# Patient Record
Sex: Male | Born: 1970 | Race: White | Hispanic: No | Marital: Married | Smoking: Never smoker
Health system: Southern US, Community
[De-identification: ages and names within clinical notes are randomized; demographics above are authoritative.]

## PROBLEM LIST (undated history)

## (undated) DIAGNOSIS — M1990 Unspecified osteoarthritis, unspecified site: Secondary | ICD-10-CM

## (undated) DIAGNOSIS — M5126 Other intervertebral disc displacement, lumbar region: Secondary | ICD-10-CM

## (undated) DIAGNOSIS — S43006A Unspecified dislocation of unspecified shoulder joint, initial encounter: Secondary | ICD-10-CM

## (undated) DIAGNOSIS — G5600 Carpal tunnel syndrome, unspecified upper limb: Secondary | ICD-10-CM

## (undated) DIAGNOSIS — F419 Anxiety disorder, unspecified: Secondary | ICD-10-CM

## (undated) DIAGNOSIS — J329 Chronic sinusitis, unspecified: Secondary | ICD-10-CM

## (undated) HISTORY — PX: BACK SURGERY: SHX140

## (undated) HISTORY — DX: Unspecified osteoarthritis, unspecified site: M19.90

---
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
## 1993-08-21 HISTORY — PX: LUMBAR LAMINECTOMY/DECOMPRESSION MICRODISCECTOMY: SHX5026

## 1998-08-21 HISTORY — PX: NASAL SINUS SURGERY: SHX719

## 2010-03-24 ENCOUNTER — Ambulatory Visit: Payer: Self-pay | Admitting: General Practice

## 2014-08-21 HISTORY — PX: CARPAL TUNNEL RELEASE: SHX101

## 2015-12-29 ENCOUNTER — Ambulatory Visit
Admission: RE | Admit: 2015-12-29 | Discharge: 2015-12-29 | Disposition: A | Discharge status: Home / Self Care | Payer: BLUE CROSS/BLUE SHIELD | Source: Ambulatory Visit | Attending: Family Medicine | Admitting: Family Medicine

## 2015-12-29 ENCOUNTER — Other Ambulatory Visit: Payer: Self-pay | Admitting: Family Medicine

## 2015-12-29 DIAGNOSIS — M19012 Primary osteoarthritis, left shoulder: Secondary | ICD-10-CM | POA: Diagnosis not present

## 2015-12-29 DIAGNOSIS — M25512 Pain in left shoulder: Secondary | ICD-10-CM | POA: Insufficient documentation

## 2015-12-29 DIAGNOSIS — M96622 Fracture of humerus following insertion of orthopedic implant, joint prosthesis, or bone plate, left arm: Secondary | ICD-10-CM | POA: Insufficient documentation

## 2016-05-16 DIAGNOSIS — M19012 Primary osteoarthritis, left shoulder: Secondary | ICD-10-CM | POA: Diagnosis not present

## 2016-07-25 DIAGNOSIS — M19012 Primary osteoarthritis, left shoulder: Secondary | ICD-10-CM | POA: Diagnosis not present

## 2016-09-20 DIAGNOSIS — M9901 Segmental and somatic dysfunction of cervical region: Secondary | ICD-10-CM | POA: Diagnosis not present

## 2016-09-20 DIAGNOSIS — M531 Cervicobrachial syndrome: Secondary | ICD-10-CM | POA: Diagnosis not present

## 2016-10-13 DIAGNOSIS — M9901 Segmental and somatic dysfunction of cervical region: Secondary | ICD-10-CM | POA: Diagnosis not present

## 2016-10-13 DIAGNOSIS — M531 Cervicobrachial syndrome: Secondary | ICD-10-CM | POA: Diagnosis not present

## 2016-10-19 DIAGNOSIS — M9901 Segmental and somatic dysfunction of cervical region: Secondary | ICD-10-CM | POA: Diagnosis not present

## 2016-10-19 DIAGNOSIS — M531 Cervicobrachial syndrome: Secondary | ICD-10-CM | POA: Diagnosis not present

## 2016-10-20 DIAGNOSIS — M9901 Segmental and somatic dysfunction of cervical region: Secondary | ICD-10-CM | POA: Diagnosis not present

## 2016-10-20 DIAGNOSIS — M531 Cervicobrachial syndrome: Secondary | ICD-10-CM | POA: Diagnosis not present

## 2016-10-23 DIAGNOSIS — M531 Cervicobrachial syndrome: Secondary | ICD-10-CM | POA: Diagnosis not present

## 2016-10-23 DIAGNOSIS — M9901 Segmental and somatic dysfunction of cervical region: Secondary | ICD-10-CM | POA: Diagnosis not present

## 2016-10-26 DIAGNOSIS — M9901 Segmental and somatic dysfunction of cervical region: Secondary | ICD-10-CM | POA: Diagnosis not present

## 2016-10-26 DIAGNOSIS — M531 Cervicobrachial syndrome: Secondary | ICD-10-CM | POA: Diagnosis not present

## 2016-10-31 DIAGNOSIS — M9901 Segmental and somatic dysfunction of cervical region: Secondary | ICD-10-CM | POA: Diagnosis not present

## 2016-10-31 DIAGNOSIS — M19012 Primary osteoarthritis, left shoulder: Secondary | ICD-10-CM | POA: Diagnosis not present

## 2016-10-31 DIAGNOSIS — M531 Cervicobrachial syndrome: Secondary | ICD-10-CM | POA: Diagnosis not present

## 2017-04-12 DIAGNOSIS — M19012 Primary osteoarthritis, left shoulder: Secondary | ICD-10-CM | POA: Diagnosis not present

## 2017-06-27 DIAGNOSIS — M19012 Primary osteoarthritis, left shoulder: Secondary | ICD-10-CM | POA: Diagnosis not present

## 2017-08-29 DIAGNOSIS — M19012 Primary osteoarthritis, left shoulder: Secondary | ICD-10-CM | POA: Diagnosis not present

## 2017-09-27 ENCOUNTER — Encounter: Payer: Self-pay | Admitting: Internal Medicine

## 2017-09-29 LAB — HEPATIC FUNCTION PANEL
ALT: 27 (ref 10–40)
AST: 18 (ref 14–40)
Alkaline Phosphatase: 82 (ref 25–125)
BILIRUBIN, TOTAL: 0.7

## 2017-09-29 LAB — CBC AND DIFFERENTIAL
HEMATOCRIT: 46 (ref 41–53)
HEMOGLOBIN: 16.4 (ref 13.5–17.5)
PLATELETS: 208 (ref 150–399)

## 2017-09-29 LAB — PSA: PSA: 0.9

## 2017-09-29 LAB — LIPID PANEL
Cholesterol: 261 — AB (ref 0–200)
HDL: 54 (ref 35–70)
LDL Cholesterol: 172

## 2017-09-29 LAB — TSH: TSH: 1.92 (ref <=5.90)

## 2017-09-29 LAB — BASIC METABOLIC PANEL: CREATININE: 1.2 (ref <=1.3)

## 2017-10-05 DIAGNOSIS — H6982 Other specified disorders of Eustachian tube, left ear: Secondary | ICD-10-CM | POA: Diagnosis not present

## 2017-11-01 DIAGNOSIS — M19012 Primary osteoarthritis, left shoulder: Secondary | ICD-10-CM | POA: Diagnosis not present

## 2017-12-15 DIAGNOSIS — H1089 Other conjunctivitis: Secondary | ICD-10-CM | POA: Diagnosis not present

## 2017-12-15 DIAGNOSIS — H00022 Hordeolum internum right lower eyelid: Secondary | ICD-10-CM | POA: Diagnosis not present

## 2017-12-25 DIAGNOSIS — M19012 Primary osteoarthritis, left shoulder: Secondary | ICD-10-CM | POA: Diagnosis not present

## 2018-01-29 DIAGNOSIS — M19012 Primary osteoarthritis, left shoulder: Secondary | ICD-10-CM | POA: Diagnosis not present

## 2018-02-06 DIAGNOSIS — M19012 Primary osteoarthritis, left shoulder: Secondary | ICD-10-CM | POA: Diagnosis not present

## 2018-02-13 DIAGNOSIS — M19012 Primary osteoarthritis, left shoulder: Secondary | ICD-10-CM | POA: Diagnosis not present

## 2018-03-13 ENCOUNTER — Ambulatory Visit (INDEPENDENT_AMBULATORY_CARE_PROVIDER_SITE_OTHER): Payer: BLUE CROSS/BLUE SHIELD | Admitting: Internal Medicine

## 2018-03-13 ENCOUNTER — Encounter: Payer: Self-pay | Admitting: Internal Medicine

## 2018-03-13 VITALS — BP 148/96 | HR 57 | Temp 98.1°F | Resp 15 | Ht 74.75 in | Wt 208.0 lb

## 2018-03-13 DIAGNOSIS — R030 Elevated blood-pressure reading, without diagnosis of hypertension: Secondary | ICD-10-CM

## 2018-03-13 DIAGNOSIS — Z9889 Other specified postprocedural states: Secondary | ICD-10-CM

## 2018-03-13 DIAGNOSIS — F409 Phobic anxiety disorder, unspecified: Secondary | ICD-10-CM

## 2018-03-13 DIAGNOSIS — G43109 Migraine with aura, not intractable, without status migrainosus: Secondary | ICD-10-CM

## 2018-03-13 DIAGNOSIS — E785 Hyperlipidemia, unspecified: Secondary | ICD-10-CM | POA: Diagnosis not present

## 2018-03-13 DIAGNOSIS — R7301 Impaired fasting glucose: Secondary | ICD-10-CM | POA: Diagnosis not present

## 2018-03-13 DIAGNOSIS — F5105 Insomnia due to other mental disorder: Secondary | ICD-10-CM

## 2018-03-13 DIAGNOSIS — R5383 Other fatigue: Secondary | ICD-10-CM | POA: Diagnosis not present

## 2018-03-13 DIAGNOSIS — M19112 Post-traumatic osteoarthritis, left shoulder: Secondary | ICD-10-CM

## 2018-03-13 MED ORDER — TRAZODONE HCL 50 MG PO TABS: 25.0000 mg | ORAL_TABLET | Freq: Every evening | ORAL | 3 refills | Status: DC | PRN

## 2018-03-13 NOTE — Progress Notes (Signed)
Subjective:  Patient ID: Justin Jefferson, male    DOB: November 01, 1970  Age: 47 y.o. MRN: [id number]  CC: The primary encounter diagnosis was Hyperlipidemia LDL goal <130. Diagnoses of Impaired fasting blood sugar, Elevated blood pressure affecting pregnancy in first trimester, antepartum, Fatigue, unspecified type, Elevated blood-pressure reading without diagnosis of hypertension, Ocular migraine, S/p bilateral carpal tunnel release, Post-traumatic osteoarthritis of left shoulder, and Insomnia due to anxiety and fear were also pertinent to this visit.  HPI Justin Jefferson presents for establishing care . Referred by wife Justin Jefferson   Anxiety:  Aggravated by work.  Uses xanax (as needed , less then once a week )  Rarely at night.  Drinks 2 beers per dinner,  2-3 ounces bourbon   at bedtime  For chronic insomnia .  Tried melatonin, did not help,  Short trial of ambien during eye surgery .  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    Painful shoulder left side only , doesn't rest well at night .  Prior dislocations,  Multiple requring surgery .    Anticipated shoulder  replacement on August 23.  Needs medical clearance.  History of ocular migraines:  occurring monthly x 18 months,  No recent eye exam  Describes sudden change in vision described as a  pixillated image in both eyes that grows in size  And then recedes before resolving   entire phenomena lasts 20  minutes.    History Justin Jefferson has a past medical history of Arthritis.   He has a past surgical history that includes Lumbar laminectomy/decompression microdiscectomy (1995); Open anterior shoulder reconstruction (Left, 1987); Carpal tunnel release (Right, 2016); and Nasal sinus surgery (2000).   His family history includes Arthritis in his maternal grandmother and paternal grandfather; COPD in his paternal grandfather; Cancer (age of onset: 104) in his father; Depression in his father; Diabetes in his maternal  grandmother and mother; Early death in his maternal grandfather; Hearing loss in his mother and paternal grandmother; Heart attack in his maternal grandfather and paternal grandfather; Heart disease in his maternal grandfather; Hyperlipidemia in his mother; Hypertension in his father, maternal grandfather, mother, and paternal grandfather; Parkinson's disease in his father; Stroke in his maternal grandfather.He reports that he has never smoked. He has never used smokeless tobacco. He reports that he drinks about 21.0 oz of alcohol per week. He reports that he does not use drugs.  Outpatient Medications Prior to Visit  Medication Sig Dispense Refill  . ALPRAZolam (XANAX) 0.25 MG tablet alprazolam 0.25 mg tablet    . meloxicam (MOBIC) 15 MG tablet Take 15 mg by mouth daily. with food  1   No facility-administered medications prior to visit.     Review of Systems:  Patient denies headache, fevers, malaise, unintentional weight loss, skin rash, eye pain, sinus congestion and sinus pain, sore throat, dysphagia,  hemoptysis , cough, dyspnea, wheezing, chest pain, palpitations, orthopnea, edema, abdominal pain, nausea, melena, diarrhea, constipation, flank pain, dysuria, hematuria, urinary  Frequency, nocturia, numbness, tingling, seizures,  Focal weakness, Loss of consciousness,  Tremor,  depression,  and suicidal ideation.     Objective:  BP (!) 148/96 (BP Location: Left Arm, Patient Position: Sitting, Cuff Size: Normal)   Pulse (!) 57   Temp 98.1 F (36.7 C) (Oral)   Resp 15   Ht 4' 2.75" (1.289 m)   Wt 208 lb (94.3 kg)   SpO2 98%   BMI 56.78 kg/m   Physical  Exam:  General appearance: alert, cooperative and appears stated age Ears: normal TM's and external ear canals both ears Throat: lips, mucosa, and tongue normal; teeth and gums normal Neck: no adenopathy, no carotid bruit, supple, symmetrical, trachea midline and thyroid not enlarged, symmetric, no tenderness/mass/nodules Back:  symmetric, no curvature. ROM normal. No CVA tenderness. Lungs: clear to auscultation bilaterally Heart: regular rate and rhythm, S1, S2 normal, no murmur, click, rub or gallop Abdomen: soft, non-tender; bowel sounds normal; no masses,  no organomegaly Pulses: 2+ and symmetric Skin: Skin color, texture, turgor normal. No rashes or lesions Lymph nodes: Cervical, supraclavicular, and axillary nodes normal.   Assessment & Plan:   Problem List Items Addressed This Visit    S/p bilateral carpal tunnel release   Ocular migraine    Diagnosed by previus PCP, occurring once monthly without headache.  No workup unless occurrences change in frequency or character       Relevant Medications   meloxicam (MOBIC) 15 MG tablet   traZODone (DESYREL) 50 MG tablet   Insomnia due to anxiety and fear    He is using excessive alcohol in an attempt to manage chronic insomnia aggravated by work stressors. Advised to reduce alcohol to 2 shots/drinks maximum before dinner and use trazodone given today       Elevated blood-pressure reading without diagnosis of hypertension    he has no history of hypertension but takes meloxicam and has an elevated reading today in office.  He has been asked to check his BP at work and  submit readings for evaluation. Renal function has been historically normal upon review of outside labs from last year and will be checked      DJD of left shoulder    Post traumatic,  Multiple dislocations,  Prior surgery,  Broken fixation screw was noted on plain fils done in 2017 .  Replacement of joint anticipated in late August .  meloxicam and tyl  enoluse reviewed.       Relevant Medications   meloxicam (MOBIC) 15 MG tablet    Other Visit Diagnoses    Hyperlipidemia LDL goal <130    -  Primary   Relevant Orders   Lipid panel   Impaired fasting blood sugar       Relevant Orders   Hemoglobin A1c   Elevated blood pressure affecting pregnancy in first trimester, antepartum        Relevant Orders   Basic metabolic panel   Microalbumin / creatinine urine ratio   Fatigue, unspecified type       Relevant Orders   CBC with Differential/Platelet      I am having Justin Jefferson start on traZODone. I am also having him maintain his meloxicam and ALPRAZolam.  Meds ordered this encounter  Medications  . traZODone (DESYREL) 50 MG tablet    Sig: Take 0.5-1 tablets (25-50 mg total) by mouth at bedtime as needed for sleep.    Dispense:  30 tablet    Refill:  3    There are no discontinued medications.  Follow-up: Return in about 6 months (around 09/13/2018).   Sherlene Shamseresa L Tullo, MD

## 2018-03-13 NOTE — Patient Instructions (Addendum)
Good to meet you!  To summarize our first visit:  1) your blood pressure is  above the currently recommended acceptable standards of 120/70, so I am recommending that you  Get 5 readings over the next month  And send to me    2) I recommend a trial of trazodone 1 hour before bedtime for your insomnia, and reduce your alcohol intake to 2 shots or drinks per night.    3) For your pain ,  You can  Take up to 2000 mg of tylenol daily along with your meloxicam .  Try taking  1000 mg  tylenol at bedtime t    For the preoperative evaluation,  You will need the following labs:  CBC BMET A1c microalbumin /cr ratio

## 2018-03-16 ENCOUNTER — Encounter: Payer: Self-pay | Admitting: Internal Medicine

## 2018-03-16 DIAGNOSIS — R030 Elevated blood-pressure reading, without diagnosis of hypertension: Secondary | ICD-10-CM | POA: Insufficient documentation

## 2018-03-16 DIAGNOSIS — F409 Phobic anxiety disorder, unspecified: Secondary | ICD-10-CM | POA: Insufficient documentation

## 2018-03-16 DIAGNOSIS — F5105 Insomnia due to other mental disorder: Secondary | ICD-10-CM | POA: Insufficient documentation

## 2018-03-16 DIAGNOSIS — G43109 Migraine with aura, not intractable, without status migrainosus: Secondary | ICD-10-CM | POA: Insufficient documentation

## 2018-03-16 DIAGNOSIS — Z9889 Other specified postprocedural states: Secondary | ICD-10-CM | POA: Insufficient documentation

## 2018-03-16 DIAGNOSIS — I10 Essential (primary) hypertension: Secondary | ICD-10-CM | POA: Insufficient documentation

## 2018-03-16 DIAGNOSIS — M19012 Primary osteoarthritis, left shoulder: Secondary | ICD-10-CM | POA: Insufficient documentation

## 2018-03-16 NOTE — Assessment & Plan Note (Addendum)
he has no history of hypertension but takes meloxicam and has an elevated reading today in office.  He has been asked to check his BP at work and  submit readings for evaluation. Renal function has been historically normal upon review of outside labs from last year and will be checked

## 2018-03-16 NOTE — Assessment & Plan Note (Signed)
Diagnosed by previus PCP, occurring once monthly without headache.  No workup unless occurrences change in frequency or character

## 2018-03-16 NOTE — Assessment & Plan Note (Signed)
He is using excessive alcohol in an attempt to manage chronic insomnia aggravated by work stressors. Advised to reduce alcohol to 2 shots/drinks maximum before dinner and use trazodone given today

## 2018-03-16 NOTE — Assessment & Plan Note (Addendum)
Post traumatic,  Multiple dislocations,  Prior surgery,  Broken fixation screw was noted on plain fils done in 2017 .  Replacement of joint anticipated in late August .  meloxicam and tyl  enoluse reviewed.

## 2018-03-20 DIAGNOSIS — E785 Hyperlipidemia, unspecified: Secondary | ICD-10-CM | POA: Diagnosis not present

## 2018-03-20 DIAGNOSIS — R5383 Other fatigue: Secondary | ICD-10-CM | POA: Diagnosis not present

## 2018-03-20 DIAGNOSIS — R030 Elevated blood-pressure reading, without diagnosis of hypertension: Secondary | ICD-10-CM | POA: Diagnosis not present

## 2018-03-20 DIAGNOSIS — R7301 Impaired fasting glucose: Secondary | ICD-10-CM | POA: Diagnosis not present

## 2018-03-21 LAB — BASIC METABOLIC PANEL
BUN/Creatinine Ratio: 9 (ref 9–20)
BUN: 11 mg/dL (ref 6–24)
CALCIUM: 9.5 mg/dL (ref 8.7–10.2)
CO2: 22 mmol/L (ref 20–29)
Chloride: 101 mmol/L (ref 96–106)
Creatinine, Ser: 1.26 mg/dL (ref 0.76–1.27)
GFR calc non Af Amer: 67 mL/min/{1.73_m2} (ref >59)
GFR, EST AFRICAN AMERICAN: 77 mL/min/{1.73_m2} (ref >59)
Glucose: 91 mg/dL (ref 65–99)
Potassium: 4.6 mmol/L (ref 3.5–5.2)
Sodium: 141 mmol/L (ref 134–144)

## 2018-03-21 LAB — CBC WITH DIFFERENTIAL/PLATELET
Basophils Absolute: 0 10*3/uL (ref 0.0–0.2)
Basos: 1 %
EOS (ABSOLUTE): 0.2 10*3/uL (ref 0.0–0.4)
EOS: 3 %
HEMATOCRIT: 47.7 % (ref 37.5–51.0)
Hemoglobin: 16.6 g/dL (ref 13.0–17.7)
IMMATURE GRANS (ABS): 0 10*3/uL (ref 0.0–0.1)
IMMATURE GRANULOCYTES: 0 %
LYMPHS: 33 %
Lymphocytes Absolute: 1.7 10*3/uL (ref 0.7–3.1)
MCH: 34.1 pg — ABNORMAL HIGH (ref 26.6–33.0)
MCHC: 34.8 g/dL (ref 31.5–35.7)
MCV: 98 fL — AB (ref 79–97)
Monocytes Absolute: 0.4 10*3/uL (ref 0.1–0.9)
Monocytes: 8 %
NEUTROS PCT: 55 %
Neutrophils Absolute: 2.9 10*3/uL (ref 1.4–7.0)
PLATELETS: 195 10*3/uL (ref 150–450)
RBC: 4.87 x10E6/uL (ref 4.14–5.80)
RDW: 12.3 % (ref 12.3–15.4)
WBC: 5.3 10*3/uL (ref 3.4–10.8)

## 2018-03-21 LAB — MICROALBUMIN / CREATININE URINE RATIO
Creatinine, Urine: 84.5 mg/dL
Microalb/Creat Ratio: <3.6 mg/g creat (ref 0.0–30.0)

## 2018-03-21 LAB — LIPID PANEL
CHOLESTEROL TOTAL: 225 mg/dL — AB (ref 100–199)
Chol/HDL Ratio: 3.9 ratio (ref 0.0–5.0)
HDL: 57 mg/dL (ref >39)
LDL CALC: 137 mg/dL — AB (ref 0–99)
TRIGLYCERIDES: 156 mg/dL — AB (ref 0–149)
VLDL Cholesterol Cal: 31 mg/dL (ref 5–40)

## 2018-03-21 LAB — HEMOGLOBIN A1C
ESTIMATED AVERAGE GLUCOSE: 91 mg/dL
Hgb A1c MFr Bld: 4.8 % (ref 4.8–5.6)

## 2018-03-25 ENCOUNTER — Encounter: Payer: Self-pay | Admitting: Internal Medicine

## 2018-03-25 ENCOUNTER — Other Ambulatory Visit: Payer: Self-pay

## 2018-03-25 DIAGNOSIS — R7301 Impaired fasting glucose: Secondary | ICD-10-CM | POA: Insufficient documentation

## 2018-04-02 NOTE — H&P (Signed)
   Justin Jefferson is an 47 y.o. male.    Chief Complaint: knee pain  Shoulder pain  HPI: Pt is a 47 y.o. male complaining of left shoulder pain for multiple years. Pain had continually increased since the beginning. X-rays in the clinic show end-stage arthritic changes of the left shoulder. Pt has tried various conservative treatments which have failed to alleviate their symptoms, including injections and therapy. Various options are discussed with the patient. Risks, benefits and expectations were discussed with the patient. Patient understand the risks, benefits and expectations and wishes to proceed with surgery.   PCP:  Sherlene Shamsullo, Teresa L, MD  D/C Plans: Home  PMH: Past Medical History:  Diagnosis Date  . Arthritis     PSH: Past Surgical History:  Procedure Laterality Date  . CARPAL TUNNEL RELEASE Right 2016   [facility]  . LUMBAR LAMINECTOMY/DECOMPRESSION MICRODISCECTOMY  1995   [facility]  . NASAL SINUS SURGERY  2000  . OPEN ANTERIOR SHOULDER RECONSTRUCTION Left 1987   secondary to football dislocation    Social History:  reports that he has never smoked. He has never used smokeless tobacco. He reports that he drinks about 35.0 standard drinks of alcohol per week. He reports that he does not use drugs.  Allergies:  No Known Allergies  Medications: No current facility-administered medications for this encounter.    Current Outpatient Medications  Medication Sig Dispense Refill  . ALPRAZolam (XANAX) 0.25 MG tablet Take 0.25 mg by mouth 2 (two) times daily as needed for anxiety.    . meloxicam (MOBIC) 15 MG tablet Take 15 mg by mouth daily. with food  1  . traZODone (DESYREL) 50 MG tablet Take 0.5-1 tablets (25-50 mg total) by mouth at bedtime as needed for sleep. (Patient taking differently: Take 50 mg by mouth at bedtime. ) 30 tablet 3    No results found for this or any previous visit (from the past 48 hour(s)). No results found.  ROS: Pain  with rom of the left upper extremity  Physical Exam: Alert and oriented 47 y.o. male in no acute distress Cranial nerves 2-12 intact Cervical spine: full rom with no tenderness, nv intact distally Chest: active breath sounds bilaterally, no wheeze rhonchi or rales Heart: regular rate and rhythm, no murmur Abd: non tender non distended with active bowel sounds Hip is stable with rom  Left shoulder with limited/painful rom nv intact distally No rashes or edema  Assessment/Plan Assessment: left shoulder end stage osteoarthritis  Plan:  Patient will undergo a left total vs reverse shoulder by [facility]. Risks benefits and expectations were discussed with the patient. Patient understand risks, benefits and expectations and wishes to proceed. Preoperative templating of the joint replacement has been completed, documented, and submitted to the Operating Room personnel in order to optimize intra-operative equipment management.   Alphonsa OverallBrad Alven Alverio PA-C, MPAS [facility] now [facility]  [place] [address], [place], [place] [address] Phone: [phone] [email or web address] Facebook  [employer]  LinkedIn  Twitter

## 2018-04-03 NOTE — Pre-Procedure Instructions (Signed)
Ralene Okravis G Coombes  04/03/2018      [id number] [place], [place] - [address] [place] [address] Phone: [phone] Fax: [phone]    Your procedure is scheduled on April 12, 2018.  [facility] Ad[facility] at 530 AM.  Call this number if you have problems the morning of surgery:  [phone]   Remember:  Do not eat or drink after midnight.      Take these medicines the morning of surgery with A SIP OF WATER  Alprazolam (xanax)-if needed  7 days prior to surgery STOP taking any meloxicam (mobic), Aspirin (unless otherwise instructed by your surgeon), Aleve, Naproxen, Ibuprofen, Motrin, Advil, Goody's, BC's, all herbal medications, fish oil, and all vitamins   Do not wear jewelry  Do not wear lotions, powders, or colognes, or deodorant.  Men may shave face and neck.  Do not bring valuables to the hospital.  [facility] is not responsible for any belongings or valuables.  Contacts, dentures or bridgework may not be worn into surgery.  Leave your suitcase in the car.  After surgery it may be brought to your room.  For patients admitted to the hospital, discharge time will be determined by your treatment team.  Patients discharged the day of surgery will not be allowed to drive home.    [place]- Preparing For Surgery  Before surgery, you can play an important role. Because skin is not sterile, your skin needs to be as free of germs as possible. You can reduce the number of germs on your skin by washing with CHG (chlorahexidine gluconate) Soap before surgery.  CHG is an antiseptic cleaner which kills germs and bonds with the skin to continue killing germs even after washing.    Oral Hygiene is also important to reduce your risk of infection.  Remember - BRUSH YOUR TEETH THE MORNING OF SURGERY WITH YOUR REGULAR TOOTHPASTE  Please do not use if you have an allergy to CHG or antibacterial soaps. If your skin becomes  reddened/irritated stop using the CHG.  Do not shave (including legs and underarms) for at least 48 hours prior to first CHG shower. It is OK to shave your face.  Please follow these instructions carefully.   1. Shower the NIGHT BEFORE SURGERY and the MORNING OF SURGERY with CHG.   2. If you chose to wash your hair, wash your hair first as usual with your normal shampoo.  3. After you shampoo, rinse your hair and body thoroughly to remove the shampoo.  4. Use CHG as you would any other liquid soap. You can apply CHG directly to the skin and wash gently with a scrungie or a clean washcloth.   5. Apply the CHG Soap to your body ONLY FROM THE NECK DOWN.  Do not use on open wounds or open sores. Avoid contact with your eyes, ears, mouth and genitals (private parts). Wash Face and genitals (private parts)  with your normal soap.  6. Wash thoroughly, paying special attention to the area where your surgery will be performed.  7. Thoroughly rinse your body with warm water from the neck down.  8. DO NOT shower/wash with your normal soap after using and rinsing off the CHG Soap.  9. Pat yourself dry with a CLEAN TOWEL.  10. Wear CLEAN PAJAMAS to bed the night before surgery, wear comfortable clothes the morning of surgery  11. Place CLEAN SHEETS on your bed the night  of your first shower and DO NOT SLEEP WITH PETS.  Day of Surgery:  Do not apply any deodorants/lotions.  Please wear clean clothes to the hospital/surgery center.   Remember to brush your teeth WITH YOUR REGULAR TOOTHPASTE.  Please read over the following fact sheets that you were given.

## 2018-04-03 NOTE — Progress Notes (Addendum)
PCP: Duncan Dulleresa Tullo, MD  Cardiologist:pt denies  EKG: 09/27/17 in EPIC  Stress test: pt denies  ECHO: pt denies  Cardiac Cath: pt denies  Chest x-ray: pt denies past year, no recent respiratory infections/complications

## 2018-04-04 ENCOUNTER — Encounter (HOSPITAL_COMMUNITY): Payer: Self-pay

## 2018-04-04 ENCOUNTER — Other Ambulatory Visit: Payer: Self-pay

## 2018-04-04 ENCOUNTER — Other Ambulatory Visit: Payer: Self-pay | Admitting: Internal Medicine

## 2018-04-04 ENCOUNTER — Encounter (HOSPITAL_COMMUNITY)
Admission: RE | Admit: 2018-04-04 | Discharge: 2018-04-04 | Disposition: A | Discharge status: Home / Self Care | Payer: BLUE CROSS/BLUE SHIELD | Source: Ambulatory Visit | Attending: Orthopedic Surgery | Admitting: Orthopedic Surgery

## 2018-04-04 DIAGNOSIS — M19012 Primary osteoarthritis, left shoulder: Secondary | ICD-10-CM | POA: Insufficient documentation

## 2018-04-04 DIAGNOSIS — Z01812 Encounter for preprocedural laboratory examination: Secondary | ICD-10-CM | POA: Insufficient documentation

## 2018-04-04 HISTORY — DX: Anxiety disorder, unspecified: F41.9

## 2018-04-04 LAB — CBC
HEMATOCRIT: 47.8 % (ref 39.0–52.0)
HEMOGLOBIN: 16.3 g/dL (ref 13.0–17.0)
MCH: 33.1 pg (ref 26.0–34.0)
MCHC: 34.1 g/dL (ref 30.0–36.0)
MCV: 97.2 fL (ref 78.0–100.0)
Platelets: 159 10*3/uL (ref 150–400)
RBC: 4.92 MIL/uL (ref 4.22–5.81)
RDW: 11.5 % (ref 11.5–15.5)
WBC: 4.6 10*3/uL (ref 4.0–10.5)

## 2018-04-04 LAB — SURGICAL PCR SCREEN
MRSA, PCR: NEGATIVE
Staphylococcus aureus: NEGATIVE

## 2018-04-12 ENCOUNTER — Encounter (HOSPITAL_COMMUNITY): Payer: Self-pay | Admitting: *Deleted

## 2018-04-12 ENCOUNTER — Inpatient Hospital Stay (HOSPITAL_COMMUNITY)
Admission: RE | Admit: 2018-04-12 | Discharge: 2018-04-13 | DRG: 483 | Disposition: A | Discharge status: Home / Self Care | Payer: BLUE CROSS/BLUE SHIELD | Source: Ambulatory Visit | Attending: Orthopedic Surgery | Admitting: Orthopedic Surgery

## 2018-04-12 ENCOUNTER — Inpatient Hospital Stay (HOSPITAL_COMMUNITY): Payer: BLUE CROSS/BLUE SHIELD

## 2018-04-12 ENCOUNTER — Other Ambulatory Visit: Payer: Self-pay

## 2018-04-12 ENCOUNTER — Inpatient Hospital Stay (HOSPITAL_COMMUNITY): Payer: BLUE CROSS/BLUE SHIELD | Admitting: Certified Registered Nurse Anesthetist

## 2018-04-12 ENCOUNTER — Encounter (HOSPITAL_COMMUNITY)
Admission: RE | Disposition: A | Discharge status: Home / Self Care | Payer: Self-pay | Source: Ambulatory Visit | Attending: Orthopedic Surgery

## 2018-04-12 DIAGNOSIS — Y793 Surgical instruments, materials and orthopedic devices (including sutures) associated with adverse incidents: Secondary | ICD-10-CM | POA: Diagnosis present

## 2018-04-12 DIAGNOSIS — Z791 Long term (current) use of non-steroidal anti-inflammatories (NSAID): Secondary | ICD-10-CM | POA: Diagnosis not present

## 2018-04-12 DIAGNOSIS — G8918 Other acute postprocedural pain: Secondary | ICD-10-CM | POA: Diagnosis not present

## 2018-04-12 DIAGNOSIS — M19012 Primary osteoarthritis, left shoulder: Secondary | ICD-10-CM | POA: Diagnosis not present

## 2018-04-12 DIAGNOSIS — T84121A Displacement of internal fixation device of left humerus, initial encounter: Secondary | ICD-10-CM | POA: Diagnosis not present

## 2018-04-12 DIAGNOSIS — Z96612 Presence of left artificial shoulder joint: Secondary | ICD-10-CM

## 2018-04-12 DIAGNOSIS — F419 Anxiety disorder, unspecified: Secondary | ICD-10-CM | POA: Diagnosis not present

## 2018-04-12 DIAGNOSIS — T84298A Other mechanical complication of internal fixation device of other bones, initial encounter: Secondary | ICD-10-CM | POA: Diagnosis not present

## 2018-04-12 DIAGNOSIS — T84218A Breakdown (mechanical) of internal fixation device of other bones, initial encounter: Secondary | ICD-10-CM | POA: Diagnosis not present

## 2018-04-12 DIAGNOSIS — T84018A Broken internal joint prosthesis, other site, initial encounter: Secondary | ICD-10-CM | POA: Diagnosis not present

## 2018-04-12 HISTORY — PX: REVERSE SHOULDER ARTHROPLASTY: SHX5054

## 2018-04-12 HISTORY — PX: OTHER SURGICAL HISTORY: SHX169

## 2018-04-12 SURGERY — ARTHROPLASTY, SHOULDER, TOTAL, REVERSE
Anesthesia: General | Site: Shoulder | Laterality: Left

## 2018-04-12 MED ORDER — BUPIVACAINE-EPINEPHRINE 0.25% -1:200000 IJ SOLN
INTRAMUSCULAR | Status: DC | PRN
  Administered 2018-04-12: 30 mL

## 2018-04-12 MED ORDER — HEMOSTATIC AGENTS (NO CHARGE) OPTIME
TOPICAL | Status: DC | PRN
  Administered 2018-04-12: 1 via TOPICAL

## 2018-04-12 MED ORDER — BUPIVACAINE-EPINEPHRINE (PF) 0.25% -1:200000 IJ SOLN
INTRAMUSCULAR | Status: AC
  Filled 2018-04-12: qty 30

## 2018-04-12 MED ORDER — ACETAMINOPHEN 325 MG PO TABS
325.0000 mg | ORAL_TABLET | Freq: Four times a day (QID) | ORAL | Status: DC | PRN
  Administered 2018-04-12: 650 mg via ORAL
  Administered 2018-04-13: 325 mg via ORAL
  Filled 2018-04-12 (×2): qty 2

## 2018-04-12 MED ORDER — ALPRAZOLAM 0.25 MG PO TABS: 0.2500 mg | ORAL_TABLET | Freq: Two times a day (BID) | ORAL | Status: DC | PRN

## 2018-04-12 MED ORDER — HYDROMORPHONE HCL 1 MG/ML IJ SOLN
0.2500 mg | INTRAMUSCULAR | Status: DC | PRN
  Administered 2018-04-12: 0.5 mg via INTRAVENOUS

## 2018-04-12 MED ORDER — THROMBIN 5000 UNITS EX SOLR
CUTANEOUS | Status: DC | PRN
  Administered 2018-04-12: 5000 [IU] via TOPICAL

## 2018-04-12 MED ORDER — LACTATED RINGERS IV SOLN
INTRAVENOUS | Status: DC | PRN
  Administered 2018-04-12 (×2): via INTRAVENOUS

## 2018-04-12 MED ORDER — CEFAZOLIN SODIUM-DEXTROSE 2-4 GM/100ML-% IV SOLN
INTRAVENOUS | Status: AC
  Filled 2018-04-12: qty 100

## 2018-04-12 MED ORDER — ONDANSETRON HCL 4 MG/2ML IJ SOLN
INTRAMUSCULAR | Status: AC
  Filled 2018-04-12: qty 2

## 2018-04-12 MED ORDER — METHOCARBAMOL 500 MG PO TABS: 500.0000 mg | ORAL_TABLET | Freq: Three times a day (TID) | ORAL | 1 refills | Status: DC | PRN

## 2018-04-12 MED ORDER — THROMBIN (RECOMBINANT) 5000 UNITS EX SOLR
CUTANEOUS | Status: AC
  Filled 2018-04-12: qty 5000

## 2018-04-12 MED ORDER — ROPIVACAINE HCL 5 MG/ML IJ SOLN
INTRAMUSCULAR | Status: DC | PRN
  Administered 2018-04-12: 30 mL via PERINEURAL

## 2018-04-12 MED ORDER — CEFAZOLIN SODIUM-DEXTROSE 2-4 GM/100ML-% IV SOLN
2.0000 g | Freq: Four times a day (QID) | INTRAVENOUS | Status: AC
  Administered 2018-04-12 – 2018-04-13 (×3): 2 g via INTRAVENOUS
  Filled 2018-04-12 (×3): qty 100

## 2018-04-12 MED ORDER — CEFAZOLIN SODIUM-DEXTROSE 2-4 GM/100ML-% IV SOLN
2.0000 g | INTRAVENOUS | Status: AC
  Administered 2018-04-12: 2 g via INTRAVENOUS

## 2018-04-12 MED ORDER — METOCLOPRAMIDE HCL 5 MG PO TABS: 5.0000 mg | ORAL_TABLET | Freq: Three times a day (TID) | ORAL | Status: DC | PRN

## 2018-04-12 MED ORDER — 0.9 % SODIUM CHLORIDE (POUR BTL) OPTIME
TOPICAL | Status: DC | PRN
  Administered 2018-04-12: 1000 mL

## 2018-04-12 MED ORDER — OXYCODONE HCL 5 MG/5ML PO SOLN: 5.0000 mg | Freq: Once | ORAL | Status: DC | PRN

## 2018-04-12 MED ORDER — EPHEDRINE 5 MG/ML INJ
INTRAVENOUS | Status: AC
  Filled 2018-04-12: qty 10

## 2018-04-12 MED ORDER — LIDOCAINE 2% (20 MG/ML) 5 ML SYRINGE
INTRAMUSCULAR | Status: DC | PRN
  Administered 2018-04-12: 60 mg via INTRAVENOUS

## 2018-04-12 MED ORDER — ONDANSETRON HCL 4 MG/2ML IJ SOLN
INTRAMUSCULAR | Status: DC | PRN
  Administered 2018-04-12: 4 mg via INTRAVENOUS

## 2018-04-12 MED ORDER — MELOXICAM 7.5 MG PO TABS
15.0000 mg | ORAL_TABLET | Freq: Every day | ORAL | Status: DC
  Administered 2018-04-13: 15 mg via ORAL
  Filled 2018-04-12: qty 2

## 2018-04-12 MED ORDER — HYDROMORPHONE HCL 1 MG/ML IJ SOLN
INTRAMUSCULAR | Status: AC
  Filled 2018-04-12: qty 1

## 2018-04-12 MED ORDER — ONDANSETRON HCL 4 MG/2ML IJ SOLN
4.0000 mg | Freq: Four times a day (QID) | INTRAMUSCULAR | Status: DC | PRN
  Administered 2018-04-12: 4 mg via INTRAVENOUS
  Filled 2018-04-12: qty 2

## 2018-04-12 MED ORDER — MEPERIDINE HCL 50 MG/ML IJ SOLN: 6.2500 mg | INTRAMUSCULAR | Status: DC | PRN

## 2018-04-12 MED ORDER — BISACODYL 10 MG RE SUPP: 10.0000 mg | Freq: Every day | RECTAL | Status: DC | PRN

## 2018-04-12 MED ORDER — OXYCODONE HCL 5 MG PO TABS
5.0000 mg | ORAL_TABLET | ORAL | Status: DC | PRN
  Administered 2018-04-12: 5 mg via ORAL
  Administered 2018-04-13 (×3): 10 mg via ORAL
  Filled 2018-04-12: qty 1
  Filled 2018-04-12 (×3): qty 2

## 2018-04-12 MED ORDER — GLYCOPYRROLATE PF 0.2 MG/ML IJ SOSY
PREFILLED_SYRINGE | INTRAMUSCULAR | Status: DC | PRN
  Administered 2018-04-12: .2 mg via INTRAVENOUS

## 2018-04-12 MED ORDER — SODIUM CHLORIDE 0.9 % IV SOLN: INTRAVENOUS | Status: DC

## 2018-04-12 MED ORDER — TRAZODONE HCL 50 MG PO TABS
50.0000 mg | ORAL_TABLET | Freq: Every day | ORAL | Status: DC
  Administered 2018-04-12: 50 mg via ORAL
  Filled 2018-04-12: qty 1

## 2018-04-12 MED ORDER — HYDROMORPHONE HCL 1 MG/ML IJ SOLN: 0.5000 mg | INTRAMUSCULAR | Status: DC | PRN

## 2018-04-12 MED ORDER — PROPOFOL 10 MG/ML IV BOLUS
INTRAVENOUS | Status: AC
  Filled 2018-04-12: qty 40

## 2018-04-12 MED ORDER — METHOCARBAMOL 500 MG PO TABS
500.0000 mg | ORAL_TABLET | Freq: Four times a day (QID) | ORAL | Status: DC | PRN
  Administered 2018-04-12 – 2018-04-13 (×2): 500 mg via ORAL
  Filled 2018-04-12 (×2): qty 1

## 2018-04-12 MED ORDER — METOCLOPRAMIDE HCL 5 MG/ML IJ SOLN: 5.0000 mg | Freq: Three times a day (TID) | INTRAMUSCULAR | Status: DC | PRN

## 2018-04-12 MED ORDER — LIDOCAINE 2% (20 MG/ML) 5 ML SYRINGE
INTRAMUSCULAR | Status: AC
  Filled 2018-04-12: qty 5

## 2018-04-12 MED ORDER — PROPOFOL 10 MG/ML IV BOLUS
INTRAVENOUS | Status: DC | PRN
  Administered 2018-04-12: 150 mg via INTRAVENOUS

## 2018-04-12 MED ORDER — OXYCODONE HCL 5 MG PO TABS: 5.0000 mg | ORAL_TABLET | Freq: Once | ORAL | Status: DC | PRN

## 2018-04-12 MED ORDER — EPHEDRINE SULFATE-NACL 50-0.9 MG/10ML-% IV SOSY
PREFILLED_SYRINGE | INTRAVENOUS | Status: DC | PRN
  Administered 2018-04-12: 5 mg via INTRAVENOUS
  Administered 2018-04-12: 10 mg via INTRAVENOUS
  Administered 2018-04-12 (×3): 5 mg via INTRAVENOUS

## 2018-04-12 MED ORDER — DEXAMETHASONE SODIUM PHOSPHATE 10 MG/ML IJ SOLN
INTRAMUSCULAR | Status: DC | PRN
  Administered 2018-04-12: 5 mg via INTRAVENOUS

## 2018-04-12 MED ORDER — DEXAMETHASONE SODIUM PHOSPHATE 10 MG/ML IJ SOLN
INTRAMUSCULAR | Status: AC
  Filled 2018-04-12: qty 1

## 2018-04-12 MED ORDER — FENTANYL CITRATE (PF) 250 MCG/5ML IJ SOLN
INTRAMUSCULAR | Status: DC | PRN
  Administered 2018-04-12 (×3): 50 ug via INTRAVENOUS

## 2018-04-12 MED ORDER — PROMETHAZINE HCL 25 MG/ML IJ SOLN: 6.2500 mg | INTRAMUSCULAR | Status: DC | PRN

## 2018-04-12 MED ORDER — MENTHOL 3 MG MT LOZG
1.0000 | LOZENGE | OROMUCOSAL | Status: DC | PRN
  Filled 2018-04-12: qty 9

## 2018-04-12 MED ORDER — SUGAMMADEX SODIUM 200 MG/2ML IV SOLN
INTRAVENOUS | Status: DC | PRN
  Administered 2018-04-12: 200 mg via INTRAVENOUS

## 2018-04-12 MED ORDER — SODIUM CHLORIDE 0.9 % IV SOLN
INTRAVENOUS | Status: DC | PRN
  Administered 2018-04-12: 15 ug/min via INTRAVENOUS

## 2018-04-12 MED ORDER — POLYETHYLENE GLYCOL 3350 17 G PO PACK: 17.0000 g | PACK | Freq: Every day | ORAL | Status: DC | PRN

## 2018-04-12 MED ORDER — OXYCODONE-ACETAMINOPHEN 5-325 MG PO TABS: 1.0000 | ORAL_TABLET | ORAL | 0 refills | Status: DC | PRN

## 2018-04-12 MED ORDER — PHENOL 1.4 % MT LIQD: 1.0000 | OROMUCOSAL | Status: DC | PRN

## 2018-04-12 MED ORDER — CHLORHEXIDINE GLUCONATE 4 % EX LIQD: 60.0000 mL | Freq: Once | CUTANEOUS | Status: DC

## 2018-04-12 MED ORDER — FENTANYL CITRATE (PF) 250 MCG/5ML IJ SOLN
INTRAMUSCULAR | Status: AC
  Filled 2018-04-12: qty 5

## 2018-04-12 MED ORDER — DOCUSATE SODIUM 100 MG PO CAPS
100.0000 mg | ORAL_CAPSULE | Freq: Two times a day (BID) | ORAL | Status: DC
  Administered 2018-04-12 – 2018-04-13 (×2): 100 mg via ORAL
  Filled 2018-04-12 (×2): qty 1

## 2018-04-12 MED ORDER — ONDANSETRON HCL 4 MG PO TABS: 4.0000 mg | ORAL_TABLET | Freq: Four times a day (QID) | ORAL | Status: DC | PRN

## 2018-04-12 MED ORDER — ROCURONIUM BROMIDE 10 MG/ML (PF) SYRINGE
PREFILLED_SYRINGE | INTRAVENOUS | Status: DC | PRN
  Administered 2018-04-12: 50 mg via INTRAVENOUS

## 2018-04-12 MED ORDER — ROCURONIUM BROMIDE 50 MG/5ML IV SOSY
PREFILLED_SYRINGE | INTRAVENOUS | Status: AC
  Filled 2018-04-12: qty 5

## 2018-04-12 MED ORDER — MIDAZOLAM HCL 5 MG/5ML IJ SOLN
INTRAMUSCULAR | Status: DC | PRN
  Administered 2018-04-12: 2 mg via INTRAVENOUS

## 2018-04-12 MED ORDER — METHOCARBAMOL 1000 MG/10ML IJ SOLN
500.0000 mg | Freq: Four times a day (QID) | INTRAVENOUS | Status: DC | PRN
  Filled 2018-04-12: qty 5

## 2018-04-12 MED ORDER — MIDAZOLAM HCL 2 MG/2ML IJ SOLN
INTRAMUSCULAR | Status: AC
  Filled 2018-04-12: qty 2

## 2018-04-12 SURGICAL SUPPLY — 64 items
BIT DRILL 5/64X5 DISP (BIT) ×1 IMPLANT
BLADE SAG 18X100X1.27 (BLADE) ×2 IMPLANT
BODY UNITE ANATOMIC SZ12 (Miscellaneous) ×1 IMPLANT
CEMENT HV SMART SET (Cement) ×1 IMPLANT
CONT SPEC 4OZ CLIKSEAL STRL BL (MISCELLANEOUS) ×1 IMPLANT
COVER SURGICAL LIGHT HANDLE (MISCELLANEOUS) ×2 IMPLANT
DECANTER SPIKE VIAL GLASS SM (MISCELLANEOUS) ×1 IMPLANT
DRAPE INCISE IOBAN 66X45 STRL (DRAPES) ×3 IMPLANT
DRAPE ORTHO SPLIT 77X108 STRL (DRAPES) ×4
DRAPE SURG ORHT 6 SPLT 77X108 (DRAPES) ×2 IMPLANT
DRAPE U-SHAPE 47X51 STRL (DRAPES) ×2 IMPLANT
DRSG ADAPTIC 3X8 NADH LF (GAUZE/BANDAGES/DRESSINGS) ×2 IMPLANT
DRSG PAD ABDOMINAL 8X10 ST (GAUZE/BANDAGES/DRESSINGS) ×2 IMPLANT
DURAPREP 26ML APPLICATOR (WOUND CARE) ×2 IMPLANT
ELECT BLADE 4.0 EZ CLEAN MEGAD (MISCELLANEOUS) ×2
ELECT NDL TIP 2.8 STRL (NEEDLE) ×1 IMPLANT
ELECT NEEDLE TIP 2.8 STRL (NEEDLE) ×2 IMPLANT
ELECT REM PT RETURN 9FT ADLT (ELECTROSURGICAL) ×2
ELECTRODE BLDE 4.0 EZ CLN MEGD (MISCELLANEOUS) ×1 IMPLANT
ELECTRODE REM PT RTRN 9FT ADLT (ELECTROSURGICAL) ×1 IMPLANT
GAUZE SPONGE 4X4 12PLY STRL (GAUZE/BANDAGES/DRESSINGS) ×2 IMPLANT
GLENOID ANCHOR PEG CROSSLK 48 (Orthopedic Implant) ×1 IMPLANT
GLOVE BIOGEL PI ORTHO PRO 7.5 (GLOVE) ×1
GLOVE BIOGEL PI ORTHO PRO SZ8 (GLOVE) ×1
GLOVE ORTHO TXT STRL SZ7.5 (GLOVE) ×2 IMPLANT
GLOVE PI ORTHO PRO STRL 7.5 (GLOVE) ×1 IMPLANT
GLOVE PI ORTHO PRO STRL SZ8 (GLOVE) ×1 IMPLANT
GLOVE SURG ORTHO 8.5 STRL (GLOVE) ×2 IMPLANT
GOWN STRL REUS W/ TWL LRG LVL3 (GOWN DISPOSABLE) ×1 IMPLANT
GOWN STRL REUS W/ TWL XL LVL3 (GOWN DISPOSABLE) ×2 IMPLANT
GOWN STRL REUS W/TWL LRG LVL3 (GOWN DISPOSABLE) ×2
GOWN STRL REUS W/TWL XL LVL3 (GOWN DISPOSABLE) ×4
HEAD HUMERAL ECC 52X18MM (Head) ×1 IMPLANT
KIT BASIN OR (CUSTOM PROCEDURE TRAY) ×2 IMPLANT
KIT TURNOVER KIT B (KITS) ×2 IMPLANT
MANIFOLD NEPTUNE II (INSTRUMENTS) ×2 IMPLANT
NDL 1/2 CIR MAYO (NEEDLE) ×1 IMPLANT
NDL HYPO 25GX1X1/2 BEV (NEEDLE) ×1 IMPLANT
NEEDLE 1/2 CIR MAYO (NEEDLE) ×2 IMPLANT
NEEDLE HYPO 25GX1X1/2 BEV (NEEDLE) ×2 IMPLANT
NS IRRIG 1000ML POUR BTL (IV SOLUTION) ×2 IMPLANT
PACK SHOULDER (CUSTOM PROCEDURE TRAY) ×2 IMPLANT
PAD ARMBOARD 7.5X6 YLW CONV (MISCELLANEOUS) ×4 IMPLANT
PIN METAGLENE 2.5 (PIN) IMPLANT
SLING ARM FOAM STRAP LRG (SOFTGOODS) ×1 IMPLANT
SMARTMIX MINI TOWER (MISCELLANEOUS) ×2
SPONGE LAP 18X18 X RAY DECT (DISPOSABLE) IMPLANT
SPONGE LAP 4X18 RFD (DISPOSABLE) ×2 IMPLANT
SPONGE SURGIFOAM ABS GEL SZ50 (HEMOSTASIS) ×1 IMPLANT
STEM UNITE SZ12 (Stem) ×1 IMPLANT
STRIP CLOSURE SKIN 1/2X4 (GAUZE/BANDAGES/DRESSINGS) ×2 IMPLANT
SUCTION FRAZIER HANDLE 10FR (MISCELLANEOUS) ×1
SUCTION TUBE FRAZIER 10FR DISP (MISCELLANEOUS) ×1 IMPLANT
SUT FIBERWIRE #2 38 T-5 BLUE (SUTURE) ×8
SUT MNCRL AB 4-0 PS2 18 (SUTURE) ×2 IMPLANT
SUT VIC AB 0 CT2 27 (SUTURE) ×2 IMPLANT
SUT VIC AB 2-0 CT1 27 (SUTURE) ×2
SUT VIC AB 2-0 CT1 TAPERPNT 27 (SUTURE) ×1 IMPLANT
SUT VICRYL 0 CT 1 36IN (SUTURE) ×2 IMPLANT
SUTURE FIBERWR #2 38 T-5 BLUE (SUTURE) IMPLANT
SYR CONTROL 10ML LL (SYRINGE) ×2 IMPLANT
TOWEL OR 17X26 10 PK STRL BLUE (TOWEL DISPOSABLE) ×2 IMPLANT
TOWER SMARTMIX MINI (MISCELLANEOUS) IMPLANT
YANKAUER SUCT BULB TIP NO VENT (SUCTIONS) ×3 IMPLANT

## 2018-04-12 NOTE — Discharge Instructions (Signed)
Keep a pillow propped behind the left elbow to keep your arm in front of you and resting across your abdomen.  Ok to use the left hand for gentle self care.  Do your exercises as instructed three to four times daily.  Ice to the front of the left shoulder.  Keep the incision clean and dry for one week, then ok to get the incision wet in the shower.  No driving for two weeks.    Call for follow up appointment in two weeks [phone]

## 2018-04-12 NOTE — Anesthesia Procedure Notes (Signed)
Anesthesia Regional Block: Interscalene brachial plexus block   Pre-Anesthetic Checklist: ,, timeout performed, Correct Patient, Correct Site, Correct Laterality, Correct Procedure, Correct Position, site marked, Risks and benefits discussed,  Surgical consent,  Pre-op evaluation,  At surgeon's request and post-op pain management  Laterality: Left  Prep: chloraprep       Needles:  Injection technique: Single-shot  Needle Type: Stimiplex     Needle Length: 9cm  Needle Gauge: 21     Additional Needles:   Procedures:,,,, ultrasound used (permanent image in chart),,,,  Narrative:  Start time: 04/12/2018 7:03 AM End time: 04/12/2018 7:08 AM Injection made incrementally with aspirations every 5 mL.  Performed by: Personally  Anesthesiologist: Lowella CurbMiller, Warren Ray, MD

## 2018-04-12 NOTE — Transfer of Care (Signed)
Immediate Anesthesia Transfer of Care Note  Patient: Justin Jefferson  Procedure(s) Performed: LEFT SHOULDER ANATOMOC VS. REVERSE SHOULDER ARTHROPLASTY (Left Shoulder)  Patient Location: PACU  Anesthesia Type:GA combined with regional for post-op pain  Level of Consciousness: awake, alert, cooperative  Airway & Oxygen Therapy: Patient Spontanous Breathing and Patient connected to nasal cannula oxygen  Post-op Assessment: Report given to RN and Post -op Vital signs reviewed and stable  Post vital signs: Reviewed and stable  Last Vitals:  Vitals Value Taken Time  BP 117/87 04/12/2018 11:07 AM  Temp    Pulse 95 04/12/2018 11:08 AM  Resp 24 04/12/2018 11:07 AM  SpO2 96 % 04/12/2018 11:08 AM  Vitals shown include unvalidated device data.  Last Pain:  Vitals:   04/12/18 0626  TempSrc: Oral  PainSc:          Complications: No apparent anesthesia complications

## 2018-04-12 NOTE — Anesthesia Preprocedure Evaluation (Signed)
Anesthesia Evaluation  Patient identified by MRN, date of birth, ID band Patient awake    Reviewed: Allergy & Precautions, NPO status , Patient's Chart, lab work & pertinent test results  Airway Mallampati: II  TM Distance: >3 FB Neck ROM: Full    Dental no notable dental hx.    Pulmonary neg pulmonary ROS,    Pulmonary exam normal breath sounds clear to auscultation       Cardiovascular negative cardio ROS Normal cardiovascular exam Rhythm:Regular Rate:Normal     Neuro/Psych  Headaches, Anxiety negative neurological ROS  negative psych ROS   GI/Hepatic negative GI ROS, Neg liver ROS,   Endo/Other  negative endocrine ROS  Renal/GU negative Renal ROS  negative genitourinary   Musculoskeletal negative musculoskeletal ROS (+) Arthritis , Osteoarthritis,    Abdominal   Peds negative pediatric ROS (+)  Hematology negative hematology ROS (+)   Anesthesia Other Findings   Reproductive/Obstetrics negative OB ROS                             Anesthesia Physical Anesthesia Plan  ASA: II  Anesthesia Plan: General   Post-op Pain Management: GA combined w/ Regional for post-op pain   Induction: Intravenous  PONV Risk Score and Plan: 2 and Ondansetron and Midazolam  Airway Management Planned: Oral ETT  Additional Equipment:   Intra-op Plan:   Post-operative Plan: Extubation in OR  Informed Consent: I have reviewed the patients History and Physical, chart, labs and discussed the procedure including the risks, benefits and alternatives for the proposed anesthesia with the patient or authorized representative who has indicated his/her understanding and acceptance.   Dental advisory given  Plan Discussed with: CRNA  Anesthesia Plan Comments:         Anesthesia Quick Evaluation

## 2018-04-12 NOTE — Anesthesia Postprocedure Evaluation (Signed)
Anesthesia Post Note  Patient: Justin Jefferson  Procedure(s) Performed: LEFT SHOULDER ANATOMOC VS. REVERSE SHOULDER ARTHROPLASTY (Left Shoulder)     Patient location during evaluation: PACU Anesthesia Type: General Level of consciousness: awake and alert Pain management: pain level controlled Vital Signs Assessment: post-procedure vital signs reviewed and stable Respiratory status: spontaneous breathing, nonlabored ventilation and respiratory function stable Cardiovascular status: blood pressure returned to baseline and stable Postop Assessment: no apparent nausea or vomiting Anesthetic complications: no    Last Vitals:  Vitals:   04/12/18 1152 04/12/18 1207  BP: 108/76 109/80  Pulse: 81 82  Resp: 13 14  Temp:    SpO2: 93% 92%    Last Pain:  Vitals:   04/12/18 1207  TempSrc:   PainSc: 4                  Lowella CurbWarren Ray Shoshanna Mcquitty

## 2018-04-12 NOTE — Interval H&P Note (Signed)
History and Physical Interval Note:  04/12/2018 7:28 AM  Justin Jefferson  has presented today for surgery, with the diagnosis of Left shoulder osteoarthritis, retained hardware  The various methods of treatment have been discussed with the patient and family. After consideration of risks, benefits and other options for treatment, the patient has consented to  Procedure(s): LEFT SHOULDER ANATOMOC VS. REVERSE SHOULDER ARTHROPLASTY (Left) as a surgical intervention .  The patient's history has been reviewed, patient examined, no change in status, stable for surgery.  I have reviewed the patient's chart and labs.  Questions were answered to the patient's satisfaction.     Michalla Ringer,STEVEN R

## 2018-04-12 NOTE — Op Note (Signed)
NAMRalene Ok: Fischman, Giankarlo G. MEDICAL RECORD [id number] ACCOUNT [id number] DATE OF BIRTH:[birth year] FACILITY: [facility] LOCATION: [facility] PHYSICIAN:STEVEN Russ Halo. Tjay Velazquez, MD  OPERATIVE REPORT  DATE OF PROCEDURE:  04/12/2018  PREOPERATIVE DIAGNOSIS:  Left shoulder end-stage osteoarthritis and retained hardware with broken screw.  POSTOPERATIVE DIAGNOSIS:  Left shoulder end-stage osteoarthritis and retained hardware with broken screw.  PROCEDURE PERFORMED:  Hardware removal, left shoulder, deep with anatomic total shoulder replacement using DePuy Global Unite system.  ATTENDING SURGEON:  Malon KindleSteven Giuliano Preece, MD  ASSISTANT:  Modesto Charonhomas Brad Dixon, [place], who was scrubbed during the entire procedure and necessary for satisfactory completion of surgery.  ANESTHESIA:  General anesthesia was used plus interscalene block.  ESTIMATED BLOOD LOSS:  Minimal.  FLUID REPLACEMENT:  1500 mL crystalloid.  INSTRUMENT COUNTS:  Correct.  COMPLICATIONS:  None.  ANTIBIOTICS:  Perioperative antibiotics were given.  INDICATIONS:  The patient is a 47 year old male with worsening left shoulder pain secondary to end-stage osteoarthritis.  The patient had a prior Bristow procedure for stabilizing his shoulder back as a youth secondary to recurrent instability.  This  involved removing his coracoid process and inserting a stainless-steel screw to hold that in position.  The screw was broken over the years and presented a potential barrier to shoulder placement.  We discussed options for management to include continued  conservative management, which will be done for years, versus surgical treatment.  The patient's pain progressed to the point where he was having daily rest pain and night pain, and this was unrelieved with cortisone injections.  Having failed  conservative management, the patient desires operative treatment to potentially remove the loose screw anteriorly and then move forward with the anatomic versus reverse  shoulder placement.  Risks and benefits of surgery discussed in detail with the  patient.  Informed consent obtained.  DESCRIPTION OF PROCEDURE:  After an adequate level of anesthesia was achieved, the patient was positioned in the modified beach-chair position.  Left shoulder correctly identified and sterilely prepped and draped in the usual manner.  A time-out was  called verifying correct patient and correct site.  After sterile prep and drape and timeout, we entered the shoulder using the patient's prior left deltopectoral incision.  Dissection down through the subcutaneous tissues.  After using a 10 blade for  the skin, we used Bovie down through the subcu tissues, identified the cephalic vein, which was intact.  We moved that laterally with the deltoid.  The pectoralis was taken medially.  The patient's conjoined tendon was not visualized due to the patient's  prior procedure.  The subscapularis was identified.  We tenodesed the biceps in situ with 0 Vicryl figure-of-eight suture.  We then released the subscapularis anatomically off the lesser tuberosity subperiosteally and then tagged that with #2 FiberWire  suture for repair at the end.  Old FiberWire suture or Ethibond suture was removed as we went down to the periosteal area.  We released that off the lesser tuberosity.  We did a complete release of the capsule inferiorly and around posteriorly, exposing  a very arthritic shoulder with large osteophytes.  We then placed the elbow at the patient's side, and with 25 degrees of external rotation, we performed our anterior to posterior cut of the humeral head with an oscillating saw.  We felt that the subscap  and the rotator cuff were sufficient to be able to move forward with an anatomic shoulder placement.  We used a ComptrollerT-handled Crego elevator over the top of the head to protect the rotator cuff  and made our cut.  Once we had that cut completed, we removed  excess osteophytes with a rongeur.  We  then subluxed the humerus posteriorly.  We mobilized the subscapularis off the anterior scapular neck and then identified the prior Bristow/lateral J piece of coracoid with the screw in it, and we were able to  remove that very carefully from the surrounding soft tissues with care taken toward protecting the axillary nerve.  Multiple large loose bodies were removed from the shoulder.  These measured 3-4 cm in diameter, 2 from posterior and one from anterior.   Once we had everything freed up, the subscapularis was gliding smoothly and good exposure of the glenoid.  We found our center point for the glenoid and drilled our central guide pin.  We then sized for a 48 glenoid component.  This is an anchor peg  glenoid.  We then reamed for the 48 glenoid and ____ initially with the power and then peripherally with a hand reamer.  We then drilled our central peg hole and fortunately did not hit the crossing screw that was still in the patient's glenoid vault and  then did our superior and 2 inferior peg holes.  With those holes drilled, we placed a piece of Gelfoam in the peripheral holes to prep for the cementing.  We then used a vacuum mixed high viscosity cement by DePuy on the back table and then injected  that into the 3 peripheral holes and impacted the glenoid APG glenoid which was a 48 into position.  It fit very well, provided excellent coverage, was centered well.  Once the cement was allowed to harden, then we checked the stability of the implant,  and it was stable and was mounted flush to the native glenoid.  At this juncture, we moved towards the humeral side.  We completed our humeral preparation, reaming up to a size 12 and then broaching for the 10 and then 12.  Again, it was about 25 degrees  of retroversion on the cut.  We trialed and then with a 52 x 18 eccentric humeral head which fit very well.  We then retrieved the trial components.  We placed drill holes in the lesser tuberosity and then  placed #2 FiberWire suture for repair of the  subscapularis.  We then placed our press-fit Global Unite stem size 12 into position and impacted that with some available bone graft with impaction grafting technique.  We had a very secure stem, and then we selected the real 52, 18 eccentric head down  posterior superiorly, and that gave perfect humeral coverage of the cut surface of the bone.  The rotator cuff was in good position.  We reduced the shoulder.  Excellent stability with the ability to translate that 50% posteriorly and 50% inferiorly.  We  then repaired the subscapularis anatomically back to the lesser tuberosity.  We had a great repair.  I was able to move the shoulder around nicely.  The repair staying together and extending together as a unit and quite stable.  At this point, we  irrigated thoroughly and then closed the deltopectoral interval with 0 Vicryl suture followed by 2-0 Vicryl for subcutaneous closure and 4-0 Monocryl for skin.  Steri-Strips were applied followed by a sterile dressing.  The patient tolerated surgery  well.  LN/NUANCE  D:04/12/2018 T:04/12/2018 JOB:[id number]

## 2018-04-12 NOTE — Brief Op Note (Signed)
04/12/2018  11:06 AM  PATIENT:  Ralene Okravis G Zinger  47 y.o. male  PRE-OPERATIVE DIAGNOSIS:  Left shoulder osteoarthritis, retained hardware  POST-OPERATIVE DIAGNOSIS:  Left shoulder osteoarthritis, retained hardware  PROCEDURE:  Left shoulder hardware removal and anatomic total shoulder replacement (DePuy- Global Unite)  SURGEON:  Surgeon(s) and Role:    Beverely Low* Janiel Crisostomo, MD - Primary  PHYSICIAN ASSISTANT:   ASSISTANTS: Thea Gisthomas B Dixon, PA-C   ANESTHESIA:   regional and general  EBL:  400 mL   BLOOD ADMINISTERED:none  DRAINS: none   LOCAL MEDICATIONS USED:  MARCAINE     SPECIMEN:  No Specimen  DISPOSITION OF SPECIMEN:  N/A  COUNTS:  YES  TOURNIQUET:  * No tourniquets in log *  DICTATION: .Other Dictation: Dictation Number [id number]  PLAN OF CARE: Admit to inpatient   PATIENT DISPOSITION:  PACU - hemodynamically stable.   Delay start of Pharmacological VTE agent (>24hrs) due to surgical blood loss or risk of bleeding: not applicable

## 2018-04-12 NOTE — Anesthesia Procedure Notes (Addendum)
Procedure Name: Intubation Date/Time: 04/12/2018 7:44 AM Performed by: Colin Benton, CRNA Pre-anesthesia Checklist: Patient identified, Emergency Drugs available, Suction available and Patient being monitored Patient Re-evaluated:Patient Re-evaluated prior to induction Oxygen Delivery Method: Circle system utilized Preoxygenation: Pre-oxygenation with 100% oxygen Induction Type: IV induction Ventilation: Mask ventilation without difficulty Laryngoscope Size: Mac and 4 Grade View: Grade II Tube type: Oral Tube size: 8.0 mm Number of attempts: 2 Airway Equipment and Method: Stylet Placement Confirmation: ETT inserted through vocal cords under direct vision,  positive ETCO2 and breath sounds checked- equal and bilateral Secured at: 24 cm Tube secured with: Tape Dental Injury: Teeth and Oropharynx as per pre-operative assessment  Comments: DL x1 with Mil 2.  Grade 3 view.  DL x 2 with MAC 4 and grade 2 view.  Successful intubation and VSS.  EBBS.

## 2018-04-13 LAB — BASIC METABOLIC PANEL
ANION GAP: 8 (ref 5–15)
BUN: 15 mg/dL (ref 6–20)
CALCIUM: 8.5 mg/dL — AB (ref 8.9–10.3)
CO2: 24 mmol/L (ref 22–32)
Chloride: 106 mmol/L (ref 98–111)
Creatinine, Ser: 1.39 mg/dL — ABNORMAL HIGH (ref 0.61–1.24)
GFR calc Af Amer: >60 mL/min (ref >60)
GFR, EST NON AFRICAN AMERICAN: 59 mL/min — AB (ref >60)
GLUCOSE: 122 mg/dL — AB (ref 70–99)
POTASSIUM: 3.9 mmol/L (ref 3.5–5.1)
Sodium: 138 mmol/L (ref 135–145)

## 2018-04-13 LAB — HEMOGLOBIN AND HEMATOCRIT, BLOOD
HCT: 42.2 % (ref 39.0–52.0)
Hemoglobin: 14.5 g/dL (ref 13.0–17.0)

## 2018-04-13 NOTE — Discharge Summary (Signed)
  Orthopedic Discharge Summary        Physician Discharge Summary  Patient ID: Justin Jefferson MRN: [id number] DOB/AGE: 48/04/1971 48 y.o.  Admit date: 04/12/2018 Discharge date: 04/13/2018   Procedures:  Left shoulder reverse total shoulder replacement  Attending Physician:  Dr. Malon KindleSteven Lejend Dalby  Admission Diagnoses:   Left shoulder OA, end stage  Discharge Diagnoses:  Left shoulder OA, end stage   Past Medical History:  Diagnosis Date  . Anxiety   . Arthritis     PCP: Sherlene Shamsullo, Teresa L, MD   Discharged Condition: good  Hospital Course:  Patient underwent the above stated procedure on 04/12/2018. Patient tolerated the procedure well and brought to the recovery room in good condition and subsequently to the floor. Patient had an uncomplicated hospital course and was stable for discharge.   Disposition: Discharge disposition: 01-Home or Self Care      with follow up in 2 weeks   Follow-up Information    Beverely LowNorris, Dairon Procter, MD. Call in 2 weeks.   Specialty:  Orthopedic Surgery Why:  [phone] Contact information: [address] [place] [place] [address] [phone]           Discharge Instructions    Call MD / Call 911   Complete by:  As directed    If you experience chest pain or shortness of breath, CALL 911 and be transported to the hospital emergency room.  If you develope a fever above 101 F, pus (white drainage) or increased drainage or redness at the wound, or calf pain, call your surgeon's office.   Constipation Prevention   Complete by:  As directed    Drink plenty of fluids.  Prune juice may be helpful.  You may use a stool softener, such as Colace (over the counter) 100 mg twice a day.  Use MiraLax (over the counter) for constipation as needed.   Diet - low sodium heart healthy   Complete by:  As directed    Driving restrictions   Complete by:  As directed    No driving for 2 weeks   Increase activity slowly as tolerated   Complete by:   As directed       Allergies as of 04/13/2018   No Known Allergies     Medication List    TAKE these medications   ALPRAZolam 0.25 MG tablet Commonly known as:  XANAX Take 0.25 mg by mouth 2 (two) times daily as needed for anxiety.   meloxicam 15 MG tablet Commonly known as:  MOBIC Take 15 mg by mouth daily. with food   methocarbamol 500 MG tablet Commonly known as:  ROBAXIN Take 1 tablet (500 mg total) by mouth 3 (three) times daily as needed.   oxyCODONE-acetaminophen 5-325 MG tablet Commonly known as:  PERCOCET/ROXICET Take 1-2 tablets by mouth every 4 (four) hours as needed for moderate pain or severe pain.   traZODone 50 MG tablet Commonly known as:  DESYREL Take 1 tablet (50 mg total) by mouth at bedtime.         Signed: Verlee RossettiORRIS,STEVEN R 04/13/2018, 9:23 AM  [facility]  [place] [address], [place], [place] [address] Phone: [phone] Facebook  Instagram  [employer]LinkedIn  Twitter

## 2018-04-13 NOTE — Evaluation (Signed)
Occupational Therapy Evaluation and Discharge Patient Details Name: Justin Jefferson MRN: [id number] DOB: [birth year] Today's Date: 04/13/2018    History of Present Illness Pt s/p LTSA   Clinical Impression   This 47 yo male admitted with above presents to acute OT with all education completed, we will D/C from acute OT.     Follow Up Recommendations  (follow up per surgeon)    Equipment Recommendations  None recommended by OT       Precautions / Restrictions Precautions Precautions: Shoulder Type of Shoulder Precautions: elbow, wrist, hand--YES; PROM/AROM: FF-90 degrees, ABD-60 degrees, External rotation--30 degrees (all for gentle ADLS) Shoulder Interventions: Shoulder sling/immobilizer;Off for dressing/bathing/exercises Precaution Booklet Issued: Yes (comment) Required Braces or Orthoses: Sling Restrictions Weight Bearing Restrictions: Yes LUE Weight Bearing: Non weight bearing Other Position/Activity Restrictions: No pushing, pulling, lifting      Mobility Bed Mobility Overal bed mobility: Modified Independent             General bed mobility comments: rolling and then up to sit from right side  Transfers Overall transfer level: Independent                        ADL either performed or assessed with clinical judgement         Vision Patient Visual Report: No change from baseline              Pertinent Vitals/Pain Pain Assessment: 0-10 Pain Score: 5  Pain Location: left shoulder Pain Descriptors / Indicators: Aching;Sore Pain Intervention(s): Limited activity within patient's tolerance;Monitored during session;Premedicated before session;Ice applied     Hand Dominance Right   Extremity/Trunk Assessment Upper Extremity Assessment Upper Extremity Assessment: LUE deficits/detail LUE Deficits / Details: shoulder sx this admission, elbow and distally pt WNL (does report forearm tightness) LUE Coordination: decreased gross motor            Communication Communication Communication: No difficulties   Cognition Arousal/Alertness: Awake/alert Behavior During Therapy: WFL for tasks assessed/performed Overall Cognitive Status: Within Functional Limits for tasks assessed                                        Exercises Other Exercises Other Exercises: Pt completed AROM for elbow, wrist, forearm, and hand (educated to do them 3x/day 10 reps each up to 5x/day if wants to)   Shoulder Instructions Shoulder Instructions Donning/doffing shirt without moving shoulder: Caregiver independent with task Method for sponge bathing under operated UE: Caregiver independent with task Donning/doffing sling/immobilizer: Caregiver independent with task Correct positioning of sling/immobilizer: Caregiver independent with task Pendulum exercises (written home exercise program): (NA) ROM for elbow, wrist and digits of operated UE: Independent Sling wearing schedule (on at all times/off for ADL's): Independent Proper positioning of operated UE when showering: Independent Dressing change: (NA) Positioning of UE while sleeping: Caregiver independent with task    Home Living Family/patient expects to be discharged to:: Private residence Living Arrangements: Spouse/significant other Available Help at Discharge: Family(intermittently 1st week and all the time 2nd week)               Bathroom Shower/Tub: Walk-in shower;Door   [employer] Toilet: Standard     Home Equipment: Hand held shower head          Prior Functioning/Environment Level of Independence: Independent  OT Problem List: Decreased range of motion;Decreased strength;Pain         OT Goals(Current goals can be found in the care plan section) Acute Rehab OT Goals Patient Stated Goal: home today  OT Frequency:                AM-PAC PT "6 Clicks" Daily Activity     Outcome Measure Help from another person eating meals?:  None Help from another person taking care of personal grooming?: A Little Help from another person toileting, which includes using toliet, bedpan, or urinal?: A Little Help from another person bathing (including washing, rinsing, drying)?: A Little Help from another person to put on and taking off regular upper body clothing?: A Little Help from another person to put on and taking off regular lower body clothing?: A Little 6 Click Score: 19   End of Session Equipment Utilized During Treatment: Other (comment)(sling) Nurse Communication: (Pt ready to go from therapy standpoint)  Activity Tolerance: Patient tolerated treatment well Patient left: in bed;with call bell/phone within reach;with family/visitor present(ice on left pectoral area)  OT Visit Diagnosis: Pain Pain - Right/Left: Left Pain - part of body: Shoulder                Time: [id number] OT Time Calculation (min): 35 min Charges:  OT General Charges $OT Visit: 1 Visit OT Evaluation $OT Eval Moderate Complexity: 1 Mod OT Treatments $Self Care/Home Management : 8-22 mins  Ignacia PalmaCathy Judea Riches, OTR/L [phone] 04/13/2018

## 2018-04-13 NOTE — Progress Notes (Signed)
Patient alert and oriented, mae's well, voiding adequate amount of urine, swallowing without difficulty, c/o mild pain at time of discharge. Patient discharged home with family. Script and discharged instructions given to patient. Patient and family stated understanding of instructions given. Patient has an appointment with Dr. Norris   

## 2018-04-13 NOTE — Progress Notes (Signed)
Orthopedics Progress Note  Subjective: Pain last night when block wore off, better controlled this morning.  Objective:  Vitals:   04/13/18 0303 04/13/18 0731  BP: 118/83 113/70  Pulse: 75 82  Resp: 18 18  Temp: 98.7 F (37.1 C) 98.7 F (37.1 C)  SpO2: 98% 94%    General: Awake and alert  Musculoskeletal: Left shoulder wound looks good with no erythema and no drainage. Dressing changed to Aquacel. Neurovascularly intact  Lab Results  Component Value Date   WBC 4.6 04/04/2018   HGB 14.5 04/13/2018   HCT 42.2 04/13/2018   MCV 97.2 04/04/2018   PLT 159 04/04/2018       Component Value Date/Time   NA 138 04/13/2018 0620   NA 141 03/20/2018 0951   K 3.9 04/13/2018 0620   CL 106 04/13/2018 0620   CO2 24 04/13/2018 0620   GLUCOSE 122 (H) 04/13/2018 0620   BUN 15 04/13/2018 0620   BUN 11 03/20/2018 0951   CREATININE 1.39 (H) 04/13/2018 0620   CALCIUM 8.5 (L) 04/13/2018 0620   GFRNONAA 59 (L) 04/13/2018 0620   GFRAA >60 04/13/2018 [id number]    No results found for: INR, PROTIME  Assessment/Plan: POD #1 s/p Procedure(s): LEFT SHOULDER ANATOMOC VS. REVERSE SHOULDER ARTHROPLASTY Ot went well this morning. Pain controlled. Discharge to home. Follow up in two weeks in the office.   Almedia BallsSteven R. Ranell PatrickNorris, MD 04/13/2018 9:21 AM

## 2018-04-15 ENCOUNTER — Encounter (HOSPITAL_COMMUNITY): Payer: Self-pay | Admitting: Orthopedic Surgery

## 2018-04-15 MED FILL — Thrombin (Recombinant) For Soln 5000 Unit: CUTANEOUS | Qty: 5000 | Status: AC

## 2018-04-25 DIAGNOSIS — M25512 Pain in left shoulder: Secondary | ICD-10-CM | POA: Diagnosis not present

## 2018-04-25 DIAGNOSIS — Z5189 Encounter for other specified aftercare: Secondary | ICD-10-CM | POA: Insufficient documentation

## 2018-04-29 DIAGNOSIS — M6281 Muscle weakness (generalized): Secondary | ICD-10-CM | POA: Diagnosis not present

## 2018-04-29 DIAGNOSIS — M25512 Pain in left shoulder: Secondary | ICD-10-CM | POA: Diagnosis not present

## 2018-04-29 DIAGNOSIS — M25612 Stiffness of left shoulder, not elsewhere classified: Secondary | ICD-10-CM | POA: Diagnosis not present

## 2018-05-02 DIAGNOSIS — M25512 Pain in left shoulder: Secondary | ICD-10-CM | POA: Diagnosis not present

## 2018-05-02 DIAGNOSIS — M25612 Stiffness of left shoulder, not elsewhere classified: Secondary | ICD-10-CM | POA: Diagnosis not present

## 2018-05-02 DIAGNOSIS — M6281 Muscle weakness (generalized): Secondary | ICD-10-CM | POA: Diagnosis not present

## 2018-05-07 DIAGNOSIS — M25612 Stiffness of left shoulder, not elsewhere classified: Secondary | ICD-10-CM | POA: Diagnosis not present

## 2018-05-07 DIAGNOSIS — M6281 Muscle weakness (generalized): Secondary | ICD-10-CM | POA: Diagnosis not present

## 2018-05-07 DIAGNOSIS — M25512 Pain in left shoulder: Secondary | ICD-10-CM | POA: Diagnosis not present

## 2018-05-09 DIAGNOSIS — M6281 Muscle weakness (generalized): Secondary | ICD-10-CM | POA: Diagnosis not present

## 2018-05-09 DIAGNOSIS — M25612 Stiffness of left shoulder, not elsewhere classified: Secondary | ICD-10-CM | POA: Diagnosis not present

## 2018-05-09 DIAGNOSIS — M25512 Pain in left shoulder: Secondary | ICD-10-CM | POA: Diagnosis not present

## 2018-05-14 DIAGNOSIS — M6281 Muscle weakness (generalized): Secondary | ICD-10-CM | POA: Diagnosis not present

## 2018-05-14 DIAGNOSIS — M25512 Pain in left shoulder: Secondary | ICD-10-CM | POA: Diagnosis not present

## 2018-05-14 DIAGNOSIS — M25612 Stiffness of left shoulder, not elsewhere classified: Secondary | ICD-10-CM | POA: Diagnosis not present

## 2018-05-16 DIAGNOSIS — M25512 Pain in left shoulder: Secondary | ICD-10-CM | POA: Diagnosis not present

## 2018-05-16 DIAGNOSIS — M6281 Muscle weakness (generalized): Secondary | ICD-10-CM | POA: Diagnosis not present

## 2018-05-16 DIAGNOSIS — M25612 Stiffness of left shoulder, not elsewhere classified: Secondary | ICD-10-CM | POA: Diagnosis not present

## 2018-05-21 ENCOUNTER — Other Ambulatory Visit: Payer: Self-pay | Admitting: Internal Medicine

## 2018-05-21 DIAGNOSIS — M25512 Pain in left shoulder: Secondary | ICD-10-CM | POA: Diagnosis not present

## 2018-05-21 DIAGNOSIS — M6281 Muscle weakness (generalized): Secondary | ICD-10-CM | POA: Diagnosis not present

## 2018-05-21 DIAGNOSIS — M25612 Stiffness of left shoulder, not elsewhere classified: Secondary | ICD-10-CM | POA: Diagnosis not present

## 2018-05-23 DIAGNOSIS — M25612 Stiffness of left shoulder, not elsewhere classified: Secondary | ICD-10-CM | POA: Diagnosis not present

## 2018-05-23 DIAGNOSIS — Z96612 Presence of left artificial shoulder joint: Secondary | ICD-10-CM | POA: Diagnosis not present

## 2018-05-23 DIAGNOSIS — M25512 Pain in left shoulder: Secondary | ICD-10-CM | POA: Diagnosis not present

## 2018-05-23 DIAGNOSIS — Z471 Aftercare following joint replacement surgery: Secondary | ICD-10-CM | POA: Diagnosis not present

## 2018-05-23 DIAGNOSIS — M6281 Muscle weakness (generalized): Secondary | ICD-10-CM | POA: Diagnosis not present

## 2018-05-23 NOTE — Telephone Encounter (Signed)
Last refill 10/02/17 Last office visit 03/13/18 Next office visit 09/13/18

## 2018-05-28 DIAGNOSIS — M25512 Pain in left shoulder: Secondary | ICD-10-CM | POA: Diagnosis not present

## 2018-05-28 DIAGNOSIS — M25612 Stiffness of left shoulder, not elsewhere classified: Secondary | ICD-10-CM | POA: Diagnosis not present

## 2018-05-28 DIAGNOSIS — M6281 Muscle weakness (generalized): Secondary | ICD-10-CM | POA: Diagnosis not present

## 2018-05-30 DIAGNOSIS — M25512 Pain in left shoulder: Secondary | ICD-10-CM | POA: Diagnosis not present

## 2018-05-30 DIAGNOSIS — M6281 Muscle weakness (generalized): Secondary | ICD-10-CM | POA: Diagnosis not present

## 2018-05-30 DIAGNOSIS — M25612 Stiffness of left shoulder, not elsewhere classified: Secondary | ICD-10-CM | POA: Diagnosis not present

## 2018-06-04 DIAGNOSIS — M25512 Pain in left shoulder: Secondary | ICD-10-CM | POA: Diagnosis not present

## 2018-06-04 DIAGNOSIS — M25612 Stiffness of left shoulder, not elsewhere classified: Secondary | ICD-10-CM | POA: Diagnosis not present

## 2018-06-04 DIAGNOSIS — M6281 Muscle weakness (generalized): Secondary | ICD-10-CM | POA: Diagnosis not present

## 2018-06-06 DIAGNOSIS — M25512 Pain in left shoulder: Secondary | ICD-10-CM | POA: Diagnosis not present

## 2018-06-06 DIAGNOSIS — M6281 Muscle weakness (generalized): Secondary | ICD-10-CM | POA: Diagnosis not present

## 2018-06-06 DIAGNOSIS — M25612 Stiffness of left shoulder, not elsewhere classified: Secondary | ICD-10-CM | POA: Diagnosis not present

## 2018-06-11 DIAGNOSIS — M6281 Muscle weakness (generalized): Secondary | ICD-10-CM | POA: Diagnosis not present

## 2018-06-11 DIAGNOSIS — M25612 Stiffness of left shoulder, not elsewhere classified: Secondary | ICD-10-CM | POA: Diagnosis not present

## 2018-06-11 DIAGNOSIS — M25512 Pain in left shoulder: Secondary | ICD-10-CM | POA: Diagnosis not present

## 2018-06-20 DIAGNOSIS — M25612 Stiffness of left shoulder, not elsewhere classified: Secondary | ICD-10-CM | POA: Diagnosis not present

## 2018-06-20 DIAGNOSIS — M25512 Pain in left shoulder: Secondary | ICD-10-CM | POA: Diagnosis not present

## 2018-06-20 DIAGNOSIS — M6281 Muscle weakness (generalized): Secondary | ICD-10-CM | POA: Diagnosis not present

## 2018-06-25 DIAGNOSIS — M6281 Muscle weakness (generalized): Secondary | ICD-10-CM | POA: Diagnosis not present

## 2018-06-25 DIAGNOSIS — M25512 Pain in left shoulder: Secondary | ICD-10-CM | POA: Diagnosis not present

## 2018-06-25 DIAGNOSIS — M25612 Stiffness of left shoulder, not elsewhere classified: Secondary | ICD-10-CM | POA: Diagnosis not present

## 2018-07-02 DIAGNOSIS — M6281 Muscle weakness (generalized): Secondary | ICD-10-CM | POA: Diagnosis not present

## 2018-07-02 DIAGNOSIS — M25612 Stiffness of left shoulder, not elsewhere classified: Secondary | ICD-10-CM | POA: Diagnosis not present

## 2018-07-02 DIAGNOSIS — M25512 Pain in left shoulder: Secondary | ICD-10-CM | POA: Diagnosis not present

## 2018-07-09 DIAGNOSIS — M25512 Pain in left shoulder: Secondary | ICD-10-CM | POA: Diagnosis not present

## 2018-07-09 DIAGNOSIS — M25612 Stiffness of left shoulder, not elsewhere classified: Secondary | ICD-10-CM | POA: Diagnosis not present

## 2018-07-09 DIAGNOSIS — M6281 Muscle weakness (generalized): Secondary | ICD-10-CM | POA: Diagnosis not present

## 2018-07-16 DIAGNOSIS — M25612 Stiffness of left shoulder, not elsewhere classified: Secondary | ICD-10-CM | POA: Diagnosis not present

## 2018-07-16 DIAGNOSIS — M25512 Pain in left shoulder: Secondary | ICD-10-CM | POA: Diagnosis not present

## 2018-07-16 DIAGNOSIS — M6281 Muscle weakness (generalized): Secondary | ICD-10-CM | POA: Diagnosis not present

## 2018-07-25 DIAGNOSIS — M25512 Pain in left shoulder: Secondary | ICD-10-CM | POA: Diagnosis not present

## 2018-07-25 DIAGNOSIS — M6281 Muscle weakness (generalized): Secondary | ICD-10-CM | POA: Diagnosis not present

## 2018-07-25 DIAGNOSIS — M25612 Stiffness of left shoulder, not elsewhere classified: Secondary | ICD-10-CM | POA: Diagnosis not present

## 2018-07-30 DIAGNOSIS — M6281 Muscle weakness (generalized): Secondary | ICD-10-CM | POA: Diagnosis not present

## 2018-07-30 DIAGNOSIS — M25612 Stiffness of left shoulder, not elsewhere classified: Secondary | ICD-10-CM | POA: Diagnosis not present

## 2018-07-30 DIAGNOSIS — M25512 Pain in left shoulder: Secondary | ICD-10-CM | POA: Diagnosis not present

## 2018-08-06 DIAGNOSIS — M6281 Muscle weakness (generalized): Secondary | ICD-10-CM | POA: Diagnosis not present

## 2018-08-06 DIAGNOSIS — M25612 Stiffness of left shoulder, not elsewhere classified: Secondary | ICD-10-CM | POA: Diagnosis not present

## 2018-08-06 DIAGNOSIS — M25512 Pain in left shoulder: Secondary | ICD-10-CM | POA: Diagnosis not present

## 2018-08-08 DIAGNOSIS — G5601 Carpal tunnel syndrome, right upper limb: Secondary | ICD-10-CM | POA: Insufficient documentation

## 2018-08-08 DIAGNOSIS — Z471 Aftercare following joint replacement surgery: Secondary | ICD-10-CM | POA: Diagnosis not present

## 2018-08-08 DIAGNOSIS — Z96612 Presence of left artificial shoulder joint: Secondary | ICD-10-CM | POA: Diagnosis not present

## 2018-08-21 HISTORY — PX: CARPAL TUNNEL RELEASE: SHX101

## 2018-08-28 ENCOUNTER — Encounter

## 2018-08-28 ENCOUNTER — Ambulatory Visit: Payer: BLUE CROSS/BLUE SHIELD | Admitting: Internal Medicine

## 2018-09-02 ENCOUNTER — Encounter: Payer: Self-pay | Admitting: Internal Medicine

## 2018-09-02 ENCOUNTER — Ambulatory Visit (INDEPENDENT_AMBULATORY_CARE_PROVIDER_SITE_OTHER): Payer: BLUE CROSS/BLUE SHIELD | Admitting: Internal Medicine

## 2018-09-02 ENCOUNTER — Encounter

## 2018-09-02 ENCOUNTER — Ambulatory Visit: Payer: BLUE CROSS/BLUE SHIELD | Admitting: Internal Medicine

## 2018-09-02 DIAGNOSIS — M65341 Trigger finger, right ring finger: Secondary | ICD-10-CM | POA: Diagnosis not present

## 2018-09-02 DIAGNOSIS — Z9889 Other specified postprocedural states: Secondary | ICD-10-CM | POA: Diagnosis not present

## 2018-09-02 DIAGNOSIS — F5105 Insomnia due to other mental disorder: Secondary | ICD-10-CM

## 2018-09-02 DIAGNOSIS — R030 Elevated blood-pressure reading, without diagnosis of hypertension: Secondary | ICD-10-CM

## 2018-09-02 DIAGNOSIS — F409 Phobic anxiety disorder, unspecified: Secondary | ICD-10-CM

## 2018-09-02 DIAGNOSIS — R7301 Impaired fasting glucose: Secondary | ICD-10-CM

## 2018-09-02 MED ORDER — PREDNISONE 10 MG PO TABS: ORAL_TABLET | ORAL | 0 refills | Status: DC

## 2018-09-02 NOTE — Progress Notes (Signed)
Subjective:  Patient ID: Justin Jefferson, male    DOB: 08/27/1970  Age: 48 y.o. MRN: [id number]  CC: Diagnoses of S/p bilateral carpal tunnel release, Impaired fasting glucose, Insomnia due to anxiety and fear, Trigger ring finger of right hand, and Elevated blood-pressure reading without diagnosis of hypertension were pertinent to this visit.  HPI Justin Jefferson presents for  Follow up and medication  refill.  He was  last seen in July for new patient/preop evaluation  for left shoulder replacement    history  of Anxiety disorder managed with xanax and trazodone for  Insomnia. Had a blackout reaction to Metcalfambien.   Getting a CPE with labs in 2 weeks at work.    Seeing ortmann at [facility]  For hand issues. Trigger finger right  4th  since May  Radiates to forearm. CT on the left  Bilateral Chronic for years . Right hand released   2018,  Both sides resolved until his shoulder surgery  .    Outpatient Medications Prior to Visit  Medication Sig Dispense Refill  . ALPRAZolam (XANAX) 0.25 MG tablet TAKE 1 TABLET BY MOUTH EVERY DAY AS NEEDED 30 tablet 4  . meloxicam (MOBIC) 15 MG tablet Take 15 mg by mouth daily. with food  1  . traZODone (DESYREL) 50 MG tablet Take 1 tablet (50 mg total) by mouth at bedtime. 90 tablet 1  . methocarbamol (ROBAXIN) 500 MG tablet Take 1 tablet (500 mg total) by mouth 3 (three) times daily as needed. (Patient not taking: Reported on 09/02/2018) 60 tablet 1  . oxyCODONE-acetaminophen (PERCOCET) 5-325 MG tablet Take 1-2 tablets by mouth every 4 (four) hours as needed for moderate pain or severe pain. (Patient not taking: Reported on 09/02/2018) 30 tablet 0   No facility-administered medications prior to visit.     Review of Systems;  Patient denies headache, fevers, malaise, unintentional weight loss, skin rash, eye pain, sinus congestion and sinus pain, sore throat, dysphagia,  hemoptysis , cough, dyspnea, wheezing, chest pain, palpitations, orthopnea, edema,  abdominal pain, nausea, melena, diarrhea, constipation, flank pain, dysuria, hematuria, urinary  Frequency, nocturia, numbness, tingling, seizures,  Focal weakness, Loss of consciousness,  Tremor, insomnia, depression, anxiety, and suicidal ideation.      Objective:  BP 134/84 (BP Location: Left Arm, Patient Position: Sitting, Cuff Size: Large)   Pulse (!) 59   Temp 98 F (36.7 C) (Oral)   Resp 15   Ht 5\' 10"  (1.778 m)   Wt 210 lb 6.4 oz (95.4 kg)   SpO2 96%   BMI 30.19 kg/m   BP Readings from Last 3 Encounters:  09/02/18 134/84  04/13/18 113/70  04/04/18 (!) 143/87    Wt Readings from Last 3 Encounters:  09/02/18 210 lb 6.4 oz (95.4 kg)  04/12/18 205 lb (93 kg)  04/04/18 208 lb 3.2 oz (94.4 kg)    General appearance: alert, cooperative and appears stated age Ears: normal TM's and external ear canals both ears Throat: lips, mucosa, and tongue normal; teeth and gums normal Neck: no adenopathy, no carotid bruit, supple, symmetrical, trachea midline and thyroid not enlarged, symmetric, no tenderness/mass/nodules Back: symmetric, no curvature. ROM normal. No CVA tenderness. Lungs: clear to auscultation bilaterally Heart: regular rate and rhythm, S1, S2 normal, no murmur, click, rub or gallop Abdomen: soft, non-tender; bowel sounds normal; no masses,  no organomegaly Pulses: 2+ and symmetric Skin: Skin color, texture, turgor normal. No rashes or lesions Lymph nodes: Cervical, supraclavicular, and axillary nodes  normal.  Lab Results  Component Value Date   HGBA1C 4.8 03/20/2018    Lab Results  Component Value Date   CREATININE 1.39 (H) 04/13/2018   CREATININE 1.26 03/20/2018   CREATININE 1.2 09/29/2017    Lab Results  Component Value Date   WBC 4.6 04/04/2018   HGB 14.5 04/13/2018   HCT 42.2 04/13/2018   PLT 159 04/04/2018   GLUCOSE 122 (H) 04/13/2018   CHOL 225 (H) 03/20/2018   TRIG 156 (H) 03/20/2018   HDL 57 03/20/2018   LDLCALC 137 (H) 03/20/2018   ALT 27  09/29/2017   AST 18 09/29/2017   NA 138 04/13/2018   K 3.9 04/13/2018   CL 106 04/13/2018   CREATININE 1.39 (H) 04/13/2018   BUN 15 04/13/2018   CO2 24 04/13/2018   TSH 1.92 09/29/2017   PSA 0.9 09/29/2017   HGBA1C 4.8 03/20/2018    No results found.  Assessment & Plan:   Problem List Items Addressed This Visit    Elevated blood-pressure reading without diagnosis of hypertension    He has no prior history of hypertension. He will check his blood pressure several times over the next 3-4 weeks and to submit readings for evaluation. Urine microalbumin to creatinine ratio done today is normal.      Impaired fasting glucose    A total of 25 minutes of face to face time was spent with patient more than half of which was spent in counselling about the benefits of a  low glycemic index diet and regular particpation in an aerobic  exercise activities.  Patient was advised to return for repeat an A1c in 6 months.       Insomnia due to anxiety and fear    Had a blackout on day 2 of ambien.  Now using trazodone. Rare use of xanax.       S/p bilateral carpal tunnel release    With recurrent symptoms on the left wrist.  Seeing Orthopedics      Trigger finger of right hand    Has appt with orthopedics for management .  Prednisone taper prescribed         I have discontinued Justin Jefferson's oxyCODONE-acetaminophen and methocarbamol. I am also having him start on predniSONE. Additionally, I am having him maintain his meloxicam, traZODone, and ALPRAZolam.  Meds ordered this encounter  Medications  . predniSONE (DELTASONE) 10 MG tablet    Sig: 6 tablets on Day 1 , then reduce by 1 tablet daily until gone    Dispense:  21 tablet    Refill:  0    Loose pills please    Medications Discontinued During This Encounter  Medication Reason  . methocarbamol (ROBAXIN) 500 MG tablet Patient has not taken in last 30 days  . oxyCODONE-acetaminophen (PERCOCET) 5-325 MG tablet Patient has not  taken in last 30 days    Follow-up: No follow-ups on file.   Sherlene Shamseresa L Leisa Gault, MD

## 2018-09-02 NOTE — Patient Instructions (Addendum)
Good to see you!    Prednisone taper for the trigger finger and CTS.  Suspend the meloxicam while you are taking it.   You can add up to 2000 mg of acetominophen (tylenol) every day safely  In divided doses TO either regimen  (500 mg every 6 hours  Or 1000 mg every 12 hours.)   The new goals for optimal blood pressure management are 120/70.  Please check your blood pressure a few times at work and send me the readings so I can determine if you need to start medication

## 2018-09-03 DIAGNOSIS — M6530 Trigger finger, unspecified finger: Secondary | ICD-10-CM | POA: Insufficient documentation

## 2018-09-03 NOTE — Assessment & Plan Note (Signed)
Had a blackout on day 2 of ambien.  Now using trazodone. Rare use of xanax.

## 2018-09-03 NOTE — Assessment & Plan Note (Signed)
With recurrent symptoms on the left wrist.  Seeing Orthopedics

## 2018-09-03 NOTE — Assessment & Plan Note (Signed)
He has no prior history of hypertension. He will check his blood pressure several times over the next 3-4 weeks and to submit readings for evaluation. Urine microalbumin to creatinine ratio done today is normal. 

## 2018-09-03 NOTE — Assessment & Plan Note (Signed)
A total of 25 minutes of face to face time was spent with patient more than half of which was spent in counselling about the benefits of a  low glycemic index diet and regular particpation in an aerobic  exercise activities.  Patient was advised to return for repeat an A1c in 6 months.  

## 2018-09-03 NOTE — Assessment & Plan Note (Addendum)
Has appt with orthopedics for management .  Prednisone taper prescribed

## 2018-09-12 DIAGNOSIS — M65341 Trigger finger, right ring finger: Secondary | ICD-10-CM | POA: Insufficient documentation

## 2018-09-12 DIAGNOSIS — G5602 Carpal tunnel syndrome, left upper limb: Secondary | ICD-10-CM | POA: Diagnosis not present

## 2018-09-13 ENCOUNTER — Ambulatory Visit: Payer: BLUE CROSS/BLUE SHIELD | Admitting: Internal Medicine

## 2018-10-08 ENCOUNTER — Ambulatory Visit: Payer: Self-pay

## 2018-10-08 NOTE — Telephone Encounter (Signed)
Left message for pt to return call to office regarding request for Tamiflu.

## 2018-10-08 NOTE — Telephone Encounter (Signed)
Patient called, left VM to return call to the office to discuss Tamiflu.  Message from [place] sent at 10/08/2018 4:23 PM EST   Patient and wife is calling to state they are needing Tamiflu to be called in.   [facility] [address] [place], [place] - [address] [phone] (Phone) [phone] (Fax)

## 2018-10-08 NOTE — Telephone Encounter (Signed)
Message from [place] sent at 10/08/2018 4:23 PM EST   Patient and wife is calling to state they are needing Tamiflu to be called in.   [id number] [place], [place] - [address] [phone] (Phone) [phone] (Fax)   [name] calling to request a prescription for Tamiflu. [name] states that their son got married this past weekend and they have been around hundreds of people. No confirmed contact with anyone who had the flu. 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 Pt began to have complaints of body aches, headache, chills,fever, sore throat and runny nose. No history of chronic medical issues. Offered pt to come in for appt but due to pt's wife schedule unable to schedule. [name] was requesting an early appt before going to work in the morning because she did not think that the pt would be able to drive hisself to an appt.No appt availability with PCP. [name] advised that if symptoms became worse to take the pt to Urgent Care. Understanding verbalized.  [name] asking if prescription for [facility] [address].  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   Reason for Disposition . [1] Influenza EXPOSURE within last 48 hours (2 days) AND [2] exposed person is a Scientist, forensic, IT consultant, or first responder (EMS) . [1] Patient is NOT HIGH RISK AND [2] strongly requests antiviral medicine AND [3] flu symptoms present < 48 hours  Answer Assessment - Initial Assessment Questions 1. TYPE of EXPOSURE: "How were you exposed?" (e.g., close contact, not a close contact)     *No Answer* 2. DATE of EXPOSURE: "When did the exposure occur?" (e.g., hour, days, weeks)     *No Answer* 3. HIGH RISK for  COMPLICATIONS: "Do you have any heart or lung problems? Do you have a weakened immune system?" (e.g., CHF, COPD, asthma, HIV positive, chemotherapy, renal failure, diabetes mellitus, sickle cell anemia)     *No Answer* 4. SYMPTOMS: "Do you have any symptoms?" (e.g., cough, fever, sore throat, difficulty breathing).     *No Answer*  Answer Assessment - Initial Assessment Questions 1. WORST SYMPTOM: "What is your worst symptom?" (e.g., cough, runny nose, muscle aches, headache, sore throat, fever)      Muscle aches 2. ONSET: "When did your flu symptoms start?"      today 3. FEVER: "Do you have a fever?" If so, ask: "What is your temperature, how was it measured, and when did it start?"     Yes, temperature not reported 4. EXPOSURE: "Were you exposed to someone with influenza?"       unsure 5. FLU VACCINE: "Did you get a flu shot this year?"     yes 6. HIGH RISK DISEASE: "Do you any chronic medical problems?" (e.g., heart or lung disease, asthma, weak immune system, or other HIGH RISK conditions)     No 7. OTHER SYMPTOMS: "Do you have any other symptoms?"  (e.g., runny nose, muscle aches, headache, sore throat)       Runny nose, sore throat, body aches and headache  Protocols used: INFLUENZA EXPOSURE-A-AH, INFLUENZA - SEASONAL-A-AH

## 2018-10-09 NOTE — Telephone Encounter (Signed)
Noted Agree with urgent care advice

## 2018-10-09 NOTE — Telephone Encounter (Signed)
Called patient's wife.  Left message and patient's wife returned call.  Spoke to pt's wife.  Patient's wife was concerned about her husband who has been having flu-like symptoms.  Pt.'s wife stated that she is only having itchy throat and eye pain.  [name] stated that her son had gotten married over the weekend and they had been around hundreds of people and asked about an appointment for today.  Informed pt's wife that Dr. Darrick Huntsman is not in the office today and all other providers are booked, including NP Arnett who is covering for Dr. Darrick Huntsman.  Informed pt's wife that I had spoken to the Clinical Team Lead who recommended that her husband be seen at an Urgent Care today.  Informed pt's wife that I have already spoken to her husband and he had agreed to go to Urgent Care.  [name] inquired about a preventative medicine for herself if her husband has the flu.  Informed pt's wife that Urgent Care should be able to give her a preventative prescription if her husband was to test positive for the flu.  Advised pt's wife to call back tomorrow for an appt if they are not able to make it to Urgent Care today.

## 2018-10-09 NOTE — Telephone Encounter (Signed)
Called and spoke to patient.  Patient said that he had a fever this morning of 102 but has been taking Tylenol and Ibuprofen now his fever is down.  Patient stated that he is also having body aches, chills and a nonproductive cough.  Patient says that he has been taking Alka-Seltzer Cold medicine.  Informed patient that Dr. Darrick Huntsman is not in the office today and Rennie Plowman, NP covering for Dr. Darrick Huntsman does not have any appointments available today but does have appointments for tomorrow.  Advised patient to go to Urgent Care today due to symptoms since Tamiflu works best if it can be started right away.  Patient stated he would go to Urgent Care today.

## 2018-10-19 ENCOUNTER — Other Ambulatory Visit: Payer: Self-pay | Admitting: Internal Medicine

## 2019-01-01 DIAGNOSIS — G5602 Carpal tunnel syndrome, left upper limb: Secondary | ICD-10-CM | POA: Diagnosis not present

## 2019-01-01 DIAGNOSIS — M67441 Ganglion, right hand: Secondary | ICD-10-CM | POA: Diagnosis not present

## 2019-01-01 DIAGNOSIS — M65341 Trigger finger, right ring finger: Secondary | ICD-10-CM | POA: Diagnosis not present

## 2019-01-14 DIAGNOSIS — G5602 Carpal tunnel syndrome, left upper limb: Secondary | ICD-10-CM | POA: Diagnosis not present

## 2019-02-07 ENCOUNTER — Other Ambulatory Visit: Payer: Self-pay | Admitting: Internal Medicine

## 2019-02-07 DIAGNOSIS — Z20828 Contact with and (suspected) exposure to other viral communicable diseases: Secondary | ICD-10-CM

## 2019-02-07 DIAGNOSIS — Z20822 Contact with and (suspected) exposure to covid-19: Secondary | ICD-10-CM

## 2019-02-27 ENCOUNTER — Emergency Department
Admission: EM | Admit: 2019-02-27 | Discharge: 2019-02-27 | Disposition: A | Discharge status: Home / Self Care | Payer: 59

## 2019-02-27 ENCOUNTER — Ambulatory Visit: Payer: Self-pay

## 2019-02-27 NOTE — ED Notes (Signed)
Called for triage  No answer in lobby 

## 2019-02-27 NOTE — Telephone Encounter (Signed)
Patient called and says he removed a tick form his left waist line near his pelvic bone on yesterday. He says last night he started running a low grade fever and today it's running 101-102. He says his body is aching, he has abdominal pain below his naval, feeling tight and tender. He says the area where the tick was removed with tweezers is red and looks like a pimple without a head. I advised him to go to the ED for evaluation due to fever after tick bite, he verbalized understanding.  Reason for Disposition . [1] 2 to 14 days following tick bite AND [2] severe headache with fever occurs  Answer Assessment - Initial Assessment Questions 1. TYPE of TICK: "Is it a wood tick or a deer tick?" If unsure, ask: "What size was the tick?" "Did it look more like a watermelon seed or a poppy seed?"      Small tick 2. LOCATION: "Where is the tick bite located?"      On left side the pelvic area of hip 3. ONSET: "How long do you think the tick was attached before you removed it?" (Hours or days)      Not there for more than 24 hours 4. TETANUS: "When was the last tetanus booster?"      Probably a couple of years 5. PREGNANCY: "Is there any chance you are pregnant?" "When was your last menstrual period?"     N/A  Protocols used: TICK BITE-A-AH

## 2019-02-28 DIAGNOSIS — S30861A Insect bite (nonvenomous) of abdominal wall, initial encounter: Secondary | ICD-10-CM | POA: Diagnosis not present

## 2019-02-28 DIAGNOSIS — Z20828 Contact with and (suspected) exposure to other viral communicable diseases: Secondary | ICD-10-CM | POA: Diagnosis not present

## 2019-02-28 DIAGNOSIS — R509 Fever, unspecified: Secondary | ICD-10-CM | POA: Diagnosis not present

## 2019-02-28 DIAGNOSIS — W57XXXA Bitten or stung by nonvenomous insect and other nonvenomous arthropods, initial encounter: Secondary | ICD-10-CM | POA: Diagnosis not present

## 2019-02-28 DIAGNOSIS — R10819 Abdominal tenderness, unspecified site: Secondary | ICD-10-CM | POA: Diagnosis not present

## 2019-02-28 NOTE — Telephone Encounter (Signed)
FYI pt is is going to [facility].

## 2019-02-28 NOTE — Telephone Encounter (Signed)
Patient called back.  Patient said that he went to the emergency room last night for a tick bite on the left side of his pelvic area but left because it was a 4 to 5 hour wait.  Patient c/o of tenderness in the abdominal area and dark urine.  Pt said that he had chills and a fever last night but no fever and no chills today.  Informed pt that PCP has no appointments available for today.  Advised pt to go to Urgent Care since he was still exhibiting symptoms and did not want to go to ED.  Recommended [facility].  Gave pt number to [facility].  Pt agreed to go [facility] today.

## 2019-02-28 NOTE — Telephone Encounter (Signed)
Called to check on patient this morning.  No answer.  LMTCB.

## 2019-04-20 ENCOUNTER — Other Ambulatory Visit: Payer: Self-pay | Admitting: Internal Medicine

## 2019-04-30 DIAGNOSIS — Z23 Encounter for immunization: Secondary | ICD-10-CM | POA: Diagnosis not present

## 2019-07-29 ENCOUNTER — Ambulatory Visit: Payer: Self-pay | Admitting: Occupational Medicine

## 2019-07-29 ENCOUNTER — Encounter: Payer: Self-pay | Admitting: Occupational Medicine

## 2019-07-29 ENCOUNTER — Other Ambulatory Visit: Payer: Self-pay

## 2019-07-29 DIAGNOSIS — Z0000 Encounter for general adult medical examination without abnormal findings: Secondary | ICD-10-CM

## 2019-08-05 LAB — POCT URINALYSIS DIPSTICK
Bilirubin, UA: NEGATIVE
Blood, UA: NEGATIVE
Glucose, UA: NEGATIVE
Ketones, UA: POSITIVE
Leukocytes, UA: NEGATIVE
Nitrite, UA: NEGATIVE
Protein, UA: POSITIVE — AB
Spec Grav, UA: 1.025 (ref 1.010–1.025)
Urobilinogen, UA: 0.2 E.U./dL
pH, UA: 6 (ref 5.0–8.0)

## 2019-08-07 LAB — CMP12+LP+TP+TSH+6AC+PSA+CBC…
ALT: 21 IU/L (ref 0–44)
Albumin/Globulin Ratio: 2.1 (ref 1.2–2.2)
Albumin: 4.6 g/dL (ref 4.0–5.0)
BUN: 14 mg/dL (ref 6–24)
Basos: 1 %
Calcium: 9.2 mg/dL (ref 8.7–10.2)
Chol/HDL Ratio: 3.8 ratio (ref 0.0–5.0)
Creatinine, Ser: 1.16 mg/dL (ref 0.76–1.27)
EOS (ABSOLUTE): 0.2 10*3/uL (ref 0.0–0.4)
Estimated CHD Risk: 0.7 times avg. (ref 0.0–1.0)
Free Thyroxine Index: 1.8 (ref 1.2–4.9)
GFR calc non Af Amer: 74 mL/min/{1.73_m2} (ref >59)
GGT: 44 IU/L (ref 0–65)
Hematocrit: 50.2 % (ref 37.5–51.0)
Hemoglobin: 17.3 g/dL (ref 13.0–17.7)
Immature Granulocytes: 0 %
Iron: 105 ug/dL (ref 38–169)
LDL Chol Calc (NIH): 141 mg/dL — ABNORMAL HIGH (ref 0–99)
Lymphs: 30 %
MCH: 33.2 pg — ABNORMAL HIGH (ref 26.6–33.0)
MCV: 96 fL (ref 79–97)
Monocytes Absolute: 0.5 10*3/uL (ref 0.1–0.9)
Monocytes: 7 %
Neutrophils: 60 %
Phosphorus: 3.4 mg/dL (ref 2.8–4.1)
Platelets: 210 10*3/uL (ref 150–450)
Prostate Specific Ag, Serum: 0.8 ng/mL (ref 0.0–4.0)
RBC: 5.21 x10E6/uL (ref 4.14–5.80)
Sodium: 143 mmol/L (ref 134–144)
T4, Total: 6.3 ug/dL (ref 4.5–12.0)
Total Protein: 6.8 g/dL (ref 6.0–8.5)
Triglycerides: 202 mg/dL — ABNORMAL HIGH (ref 0–149)
Uric Acid: 6.8 mg/dL (ref 3.8–8.4)
WBC: 7.3 10*3/uL (ref 3.4–10.8)

## 2019-08-07 LAB — CMP12+LP+TP+TSH+6AC+PSA+CBC?
AST: 18 IU/L (ref 0–40)
Alkaline Phosphatase: 113 IU/L (ref 39–117)
BUN/Creatinine Ratio: 12 (ref 9–20)
Basophils Absolute: 0.1 10*3/uL (ref 0.0–0.2)
Bilirubin Total: 0.5 mg/dL (ref 0.0–1.2)
Chloride: 104 mmol/L (ref 96–106)
Cholesterol, Total: 240 mg/dL — ABNORMAL HIGH (ref 100–199)
Eos: 2 %
GFR calc Af Amer: 85 mL/min/{1.73_m2} (ref >59)
Globulin, Total: 2.2 g/dL (ref 1.5–4.5)
Glucose: 76 mg/dL (ref 65–99)
HDL: 63 mg/dL (ref >39)
Immature Grans (Abs): 0 10*3/uL (ref 0.0–0.1)
LDH: 195 IU/L (ref 121–224)
Lymphocytes Absolute: 2.2 10*3/uL (ref 0.7–3.1)
MCHC: 34.5 g/dL (ref 31.5–35.7)
Neutrophils Absolute: 4.4 10*3/uL (ref 1.4–7.0)
Potassium: 4.1 mmol/L (ref 3.5–5.2)
RDW: 12.4 % (ref 11.6–15.4)
T3 Uptake Ratio: 29 % (ref 24–39)
TSH: 1.43 u[IU]/mL (ref 0.450–4.500)
VLDL Cholesterol Cal: 36 mg/dL (ref 5–40)

## 2019-09-30 NOTE — Progress Notes (Signed)
Patient comes in today for pre physical labs, EKG and hearing exam. 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

## 2019-10-01 ENCOUNTER — Other Ambulatory Visit: Payer: Self-pay

## 2019-10-01 ENCOUNTER — Ambulatory Visit: Payer: Self-pay

## 2019-10-01 DIAGNOSIS — Z0000 Encounter for general adult medical examination without abnormal findings: Secondary | ICD-10-CM

## 2019-10-01 LAB — POCT URINALYSIS DIPSTICK
Bilirubin, UA: NEGATIVE
Blood, UA: NEGATIVE
Glucose, UA: NEGATIVE
Ketones, UA: NEGATIVE
Leukocytes, UA: NEGATIVE
Nitrite, UA: NEGATIVE
Protein, UA: NEGATIVE
Spec Grav, UA: 1.025 (ref 1.010–1.025)
Urobilinogen, UA: 0.2 E.U./dL
pH, UA: 5.5 (ref 5.0–8.0)

## 2019-10-04 LAB — CMP12+LP+TP+TSH+6AC+PSA+CBC…
AST: 20 IU/L (ref 0–40)
Albumin/Globulin Ratio: 2 (ref 1.2–2.2)
Alkaline Phosphatase: 103 IU/L (ref 39–117)
BUN/Creatinine Ratio: 11 (ref 9–20)
Basophils Absolute: 0.1 10*3/uL (ref 0.0–0.2)
Basos: 1 %
Cholesterol, Total: 231 mg/dL — ABNORMAL HIGH (ref 100–199)
Eos: 3 %
Estimated CHD Risk: 0.7 times avg. (ref 0.0–1.0)
Free Thyroxine Index: 1.7 (ref 1.2–4.9)
GFR calc Af Amer: 79 mL/min/{1.73_m2} (ref >59)
GGT: 54 IU/L (ref 0–65)
Hemoglobin: 16.8 g/dL (ref 13.0–17.7)
Immature Grans (Abs): 0 10*3/uL (ref 0.0–0.1)
Immature Granulocytes: 1 %
Iron: 113 ug/dL (ref 38–169)
LDH: 211 IU/L (ref 121–224)
LDL Chol Calc (NIH): 141 mg/dL — ABNORMAL HIGH (ref 0–99)
Lymphs: 34 %
MCH: 34.1 pg — ABNORMAL HIGH (ref 26.6–33.0)
Neutrophils Absolute: 3.1 10*3/uL (ref 1.4–7.0)
Neutrophils: 54 %
RBC: 4.93 x10E6/uL (ref 4.14–5.80)
T3 Uptake Ratio: 28 % (ref 24–39)
TSH: 1.49 u[IU]/mL (ref 0.450–4.500)
Triglycerides: 164 mg/dL — ABNORMAL HIGH (ref 0–149)

## 2019-10-04 LAB — CMP12+LP+TP+TSH+6AC+PSA+CBC?
ALT: 26 IU/L (ref 0–44)
Albumin: 4.4 g/dL (ref 4.0–5.0)
BUN: 14 mg/dL (ref 6–24)
Bilirubin Total: 0.6 mg/dL (ref 0.0–1.2)
Calcium: 9 mg/dL (ref 8.7–10.2)
Chloride: 107 mmol/L — ABNORMAL HIGH (ref 96–106)
Chol/HDL Ratio: 3.8 ratio (ref 0.0–5.0)
Creatinine, Ser: 1.23 mg/dL (ref 0.76–1.27)
EOS (ABSOLUTE): 0.2 10*3/uL (ref 0.0–0.4)
GFR calc non Af Amer: 68 mL/min/{1.73_m2} (ref >59)
Globulin, Total: 2.2 g/dL (ref 1.5–4.5)
Glucose: 93 mg/dL (ref 65–99)
HDL: 61 mg/dL (ref >39)
Hematocrit: 46.4 % (ref 37.5–51.0)
Lymphocytes Absolute: 1.9 10*3/uL (ref 0.7–3.1)
MCHC: 36.2 g/dL — ABNORMAL HIGH (ref 31.5–35.7)
MCV: 94 fL (ref 79–97)
Monocytes Absolute: 0.4 10*3/uL (ref 0.1–0.9)
Monocytes: 7 %
Phosphorus: 3 mg/dL (ref 2.8–4.1)
Platelets: 203 10*3/uL (ref 150–450)
Potassium: 4.8 mmol/L (ref 3.5–5.2)
Prostate Specific Ag, Serum: 2.1 ng/mL (ref 0.0–4.0)
RDW: 12.7 % (ref 11.6–15.4)
Sodium: 141 mmol/L (ref 134–144)
T4, Total: 6.1 ug/dL (ref 4.5–12.0)
Total Protein: 6.6 g/dL (ref 6.0–8.5)
Uric Acid: 8.2 mg/dL (ref 3.8–8.4)
VLDL Cholesterol Cal: 29 mg/dL (ref 5–40)
WBC: 5.6 10*3/uL (ref 3.4–10.8)

## 2019-10-04 LAB — QUANTIFERON-TB GOLD PLUS
QuantiFERON Mitogen Value: >10 IU/mL
QuantiFERON Nil Value: 0.03 IU/mL
QuantiFERON TB1 Ag Value: 0.04 IU/mL
QuantiFERON TB2 Ag Value: 0.05 IU/mL
QuantiFERON-TB Gold Plus: NEGATIVE

## 2019-10-07 ENCOUNTER — Other Ambulatory Visit: Payer: Self-pay

## 2019-10-07 ENCOUNTER — Ambulatory Visit: Payer: Self-pay | Admitting: Occupational Medicine

## 2019-10-07 VITALS — BP 148/92 | HR 67 | Temp 97.7°F | Ht 70.0 in | Wt 214.4 lb

## 2019-10-07 DIAGNOSIS — Z1211 Encounter for screening for malignant neoplasm of colon: Secondary | ICD-10-CM

## 2019-10-07 DIAGNOSIS — Z0000 Encounter for general adult medical examination without abnormal findings: Secondary | ICD-10-CM

## 2019-10-07 NOTE — Addendum Note (Signed)
Addended by: Gardner Candle on: 10/07/2019 02:53 PM   Modules accepted: Orders

## 2019-10-07 NOTE — Progress Notes (Signed)
Subjective:     Patient ID: Justin Jefferson, male   DOB: 1970-10-20, 49 y.o.   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  He has a past medical history of insomnia, borderline hypertension, osteoarthritis of both hands.  Past surgical history is remarkable for left total shoulder replacement several years ago-recovered well with no limitations and he also had lumbar surgery back in 1995 and no problems since then.  He is in for his annual physical.  His only complaint is of some mild right inguinal discomfort with coughing.  He has not noticed any bulging or history of right inguinal hernia.  We had a discussion about this and he elected to not have me evaluate this today in his employment exam but he will follow-up with his personal urologist that we will also evaluate him for prostate cancer as well.  Other health maintenance issues that need to be addressed are colonoscopy.  Our onsite employer medical clinic will arrange referral to GI for screening colonoscopy.  Justin Jefferson will also follow-up with his personal dermatologist for his annual skin exam.  No other complaints or symptoms identified in today's employment exam.  Current Outpatient Medications on File Prior to Visit  Medication Sig Dispense Refill  . meloxicam (MOBIC) 15 MG tablet Take 15 mg by mouth daily. with food  1  . traZODone (DESYREL) 50 MG tablet TAKE 1 TABLET BY MOUTH EVERYDAY AT BEDTIME 90 tablet 1   No current facility-administered medications on file prior to visit.   Review of Systems     Objective:   Physical Exam    BP (!) 148/92   Pulse 67   Temp 97.7 F (36.5 C) (Oral)   Ht 5\' 10"  (1.778 m)   Wt 214 lb 6.4 oz (97.3 kg)   SpO2 98%   BMI 30.76 kg/m   General Appearance:    Alert, cooperative, no distress, appears stated age  Head:    Normocephalic, without obvious abnormality, atraumatic  Eyes:    PERRL, conjunctiva/corneas clear, EOM's intact,  fundi    benign, both eyes  Ears:    Normal TM's and external ear canals, both ears  Nose:   Nares normal, septum midline, mucosa normal, no drainage    or sinus tenderness  Throat:   Lips, mucosa, and tongue normal; teeth and gums normal  Neck:   Supple, symmetrical, trachea midline, no adenopathy;    thyroid:  no enlargement/tenderness/nodules; no carotid   bruit or JVD  Back:     Symmetric, no curvature, ROM normal, no CVA tenderness  Lungs:     Clear to auscultation bilaterally, respirations unlabored  Chest Wall:    No tenderness or deformity   Heart:    Regular rate and rhythm, S1 and S2 normal, no murmur, rub   or gallop  Breast Exam:    No tenderness, masses, or nipple abnormality  Abdomen:     Soft, non-tender, bowel sounds active all four quadrants,    no masses, no organomegaly        Extremities:   Extremities normal, atraumatic, no cyanosis or edema  Pulses:   2+ and symmetric all extremities     Lymph nodes:   Cervical, supraclavicular, and axillary nodes normal  Neurologic:   CNII-XII intact, normal strength, sensation and reflexes    throughout   m Today's Vitals   10/07/19 1312  BP: (!) 148/92  Pulse: 67  Temp: 97.7  F (36.5 C)  TempSrc: Oral  SpO2: 98%  Weight: 214 lb 6.4 oz (97.3 kg)  Height: 5\' 10"  (1.778 m)   Body mass index is 30.76 kg/m. Assessment:     Annual employment physical    Plan:     Normal labs today.  Mild high tone hearing loss noted on audiometric screening today.  No standard threshold shift.  Our clinic will arrange screening colonoscopy and Justin Jefferson will arrange for urology referral and follow-up with his dermatologist regarding annual skin check.  Qualified for full duty.

## 2019-10-23 ENCOUNTER — Other Ambulatory Visit: Payer: Self-pay | Admitting: Internal Medicine

## 2019-11-03 ENCOUNTER — Encounter: Payer: Self-pay | Admitting: *Deleted

## 2019-11-10 DIAGNOSIS — N401 Enlarged prostate with lower urinary tract symptoms: Secondary | ICD-10-CM | POA: Diagnosis not present

## 2019-11-10 DIAGNOSIS — Z125 Encounter for screening for malignant neoplasm of prostate: Secondary | ICD-10-CM | POA: Diagnosis not present

## 2019-11-10 DIAGNOSIS — K4090 Unilateral inguinal hernia, without obstruction or gangrene, not specified as recurrent: Secondary | ICD-10-CM | POA: Diagnosis not present

## 2019-12-10 IMAGING — DX DG SHOULDER 1V*L*
1 series · 1 of 1 positions shown · non-contrast
Comparison: Radiographs dated 12/29/2015

CLINICAL DATA: Severe arthritis of the left shoulder. Status post
total shoulder replacement.

EXAM:
LEFT SHOULDER - 1 VIEW

[shoulder]
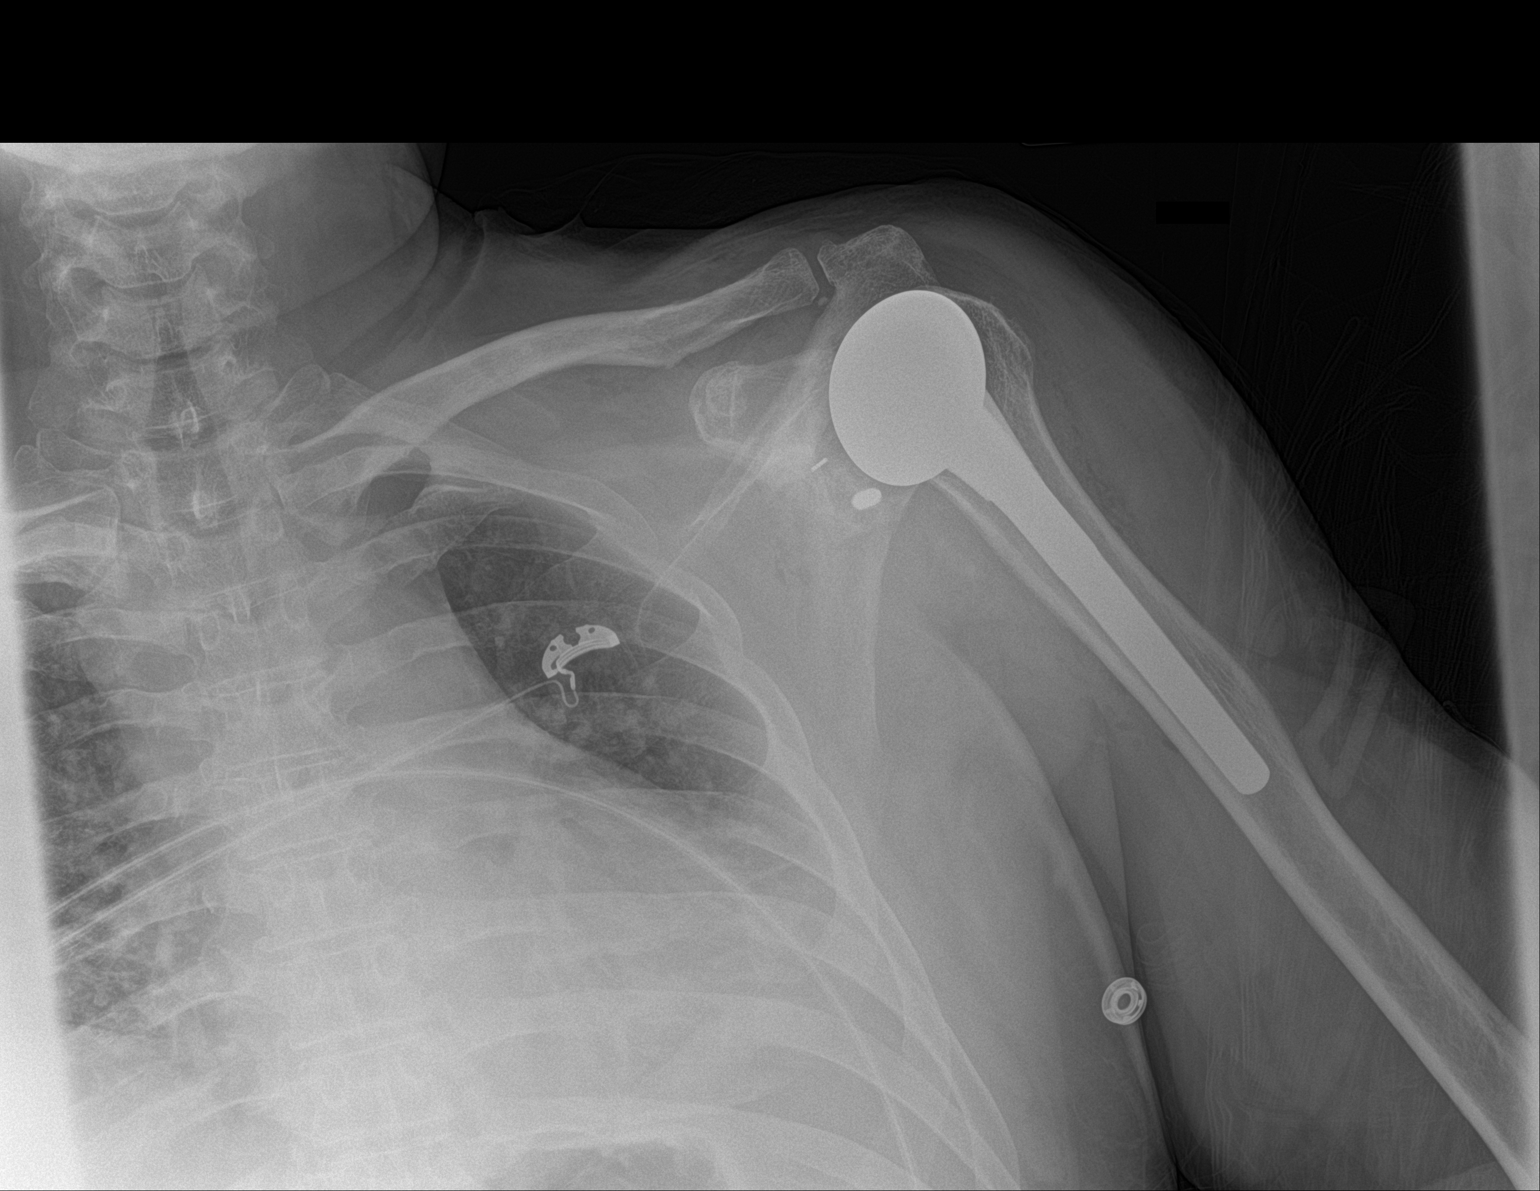

[1 of 1 positions shown; findings below may reference images not displayed]

FINDINGS: The patient has undergone left total shoulder replacement. One
portion of the fractured screw seen on the prior exam has been
removed.

No fracture or dislocation. Satisfactory appearance of the left
shoulder in the AP projection after total shoulder replacement.
IMPRESSION: Satisfactory appearance of the left shoulder in the AP projection
after total shoulder replacement.

## 2019-12-22 DIAGNOSIS — R972 Elevated prostate specific antigen [PSA]: Secondary | ICD-10-CM | POA: Diagnosis not present

## 2020-02-16 DIAGNOSIS — Z03818 Encounter for observation for suspected exposure to other biological agents ruled out: Secondary | ICD-10-CM | POA: Diagnosis not present

## 2020-02-16 DIAGNOSIS — J019 Acute sinusitis, unspecified: Secondary | ICD-10-CM | POA: Diagnosis not present

## 2020-02-16 DIAGNOSIS — B9689 Other specified bacterial agents as the cause of diseases classified elsewhere: Secondary | ICD-10-CM | POA: Diagnosis not present

## 2020-04-20 ENCOUNTER — Other Ambulatory Visit: Payer: Self-pay | Admitting: Internal Medicine

## 2020-04-23 ENCOUNTER — Ambulatory Visit (INDEPENDENT_AMBULATORY_CARE_PROVIDER_SITE_OTHER): Payer: 59

## 2020-04-23 ENCOUNTER — Ambulatory Visit (INDEPENDENT_AMBULATORY_CARE_PROVIDER_SITE_OTHER): Payer: 59 | Admitting: Internal Medicine

## 2020-04-23 ENCOUNTER — Other Ambulatory Visit: Payer: Self-pay

## 2020-04-23 ENCOUNTER — Encounter: Payer: Self-pay | Admitting: Internal Medicine

## 2020-04-23 VITALS — BP 130/70 | HR 76 | Temp 98.4°F | Resp 15 | Ht 70.0 in | Wt 210.6 lb

## 2020-04-23 DIAGNOSIS — R05 Cough: Secondary | ICD-10-CM

## 2020-04-23 DIAGNOSIS — L02429 Furuncle of limb, unspecified: Secondary | ICD-10-CM

## 2020-04-23 DIAGNOSIS — F409 Phobic anxiety disorder, unspecified: Secondary | ICD-10-CM

## 2020-04-23 DIAGNOSIS — R059 Cough, unspecified: Secondary | ICD-10-CM

## 2020-04-23 DIAGNOSIS — F5105 Insomnia due to other mental disorder: Secondary | ICD-10-CM

## 2020-04-23 DIAGNOSIS — R69 Illness, unspecified: Secondary | ICD-10-CM | POA: Diagnosis not present

## 2020-04-23 MED ORDER — ALPRAZOLAM 0.25 MG PO TABS: 0.2500 mg | ORAL_TABLET | Freq: Two times a day (BID) | ORAL | 1 refills | Status: DC | PRN

## 2020-04-23 MED ORDER — SULFAMETHOXAZOLE-TRIMETHOPRIM 800-160 MG PO TABS: 1.0000 | ORAL_TABLET | Freq: Two times a day (BID) | ORAL | 0 refills | Status: DC

## 2020-04-23 NOTE — Patient Instructions (Addendum)
Chest x ray today for persistent cough.  Treatment will depend on results of x ray    Septra DS twice dialy for the axillary boil   Hot compresses several time daily will enable  it to liquefy and  drain. DO NOT PICK AT IT.    Marland KitchenPlease take a probiotic ( Align, Floraque or Culturelle) or ear yogurt daily for 2 weeks  to prevent a serious antibiotic associated diarrhea  Called clostirudium dificile colitis    Alprazolam refilled for "Prn use"  Do not use twice daily for more than one week

## 2020-04-23 NOTE — Progress Notes (Signed)
Subjective:  Patient ID: Justin Jefferson, male    DOB: February 28, 1971  Age: 49 y.o. MRN: [id number]  CC: The primary encounter diagnosis was Cough in adult. Diagnoses of Furuncle of axillary fold and Insomnia due to anxiety and fear were also pertinent to this visit.  HPI Justin Jefferson presents for 1) MED REFILL 2) axillary boils 3) persistent cough  This visit occurred during the SARS-CoV-2 public health emergency.  Safety protocols were in place, including screening questions prior to the visit, additional usage of staff PPE, and extensive cleaning of exam room while observing appropriate contact time as indicated for disinfecting solutions.     Patient has received both doses of the available COVID 19 vaccines.   Patient continues to mask when outside of the home except when walking in yard or at safe distances from others .  Patient denies any change in mood or development of unhealthy behaviors resuting from the pandemic's restriction of activities and socialization.   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  Needs alprazolam refilled to manage stress during the day ,  Using trazodone for insomnia   2) negative COVID PCR TEST IN June for febrile illness,  had cough,  malaise,  Rhinitis. Did not receive abx or chest x ray  Has had a dry cough since then, aggravated by activity and cold air, but not waking him up at night  No wheezing    3) Treated by  urology for spike in PSA .repeat test was normal.   Told he has an inguinal hernia right side.  No surgery planned as he is asymptoatic  4) had CPE labs in February,  reveiwed with patient   Outpatient Medications Prior to Visit  Medication Sig Dispense Refill  . meloxicam (MOBIC) 15 MG tablet Take 15 mg by mouth daily. with food  1  . traZODone (DESYREL) 50 MG tablet TAKE 1 TABLET BY MOUTH EVERYDAY AT BEDTIME 90 tablet 1   No facility-administered medications prior to visit.     Review of Systems;  Patient denies headache, fevers, malaise, unintentional weight loss, skin rash, eye pain, sinus congestion and sinus pain, sore throat, dysphagia,  hemoptysis , cough, dyspnea, wheezing, chest pain, palpitations, orthopnea, edema, abdominal pain, nausea, melena, diarrhea, constipation, flank pain, dysuria, hematuria, urinary  Frequency, nocturia, numbness, tingling, seizures,  Focal weakness, Loss of consciousness,  Tremor, insomnia, depression, anxiety, and suicidal ideation.      Objective:  BP 130/70 (BP Location: Left Arm, Patient Position: Sitting, Cuff Size: Large)   Pulse 76   Temp 98.4 F (36.9 C) (Oral)   Resp 15   Ht 5\' 10"  (1.778 m)   Wt 210 lb 9.6 oz (95.5 kg)   SpO2 98%   BMI 30.22 kg/m   BP Readings from Last 3 Encounters:  04/23/20 130/70  10/07/19 (!) 148/92  09/02/18 134/84    Wt Readings from Last 3 Encounters:  04/23/20 210 lb 9.6 oz (95.5 kg)  10/07/19 214 lb 6.4 oz (97.3 kg)  09/02/18 210 lb 6.4 oz (95.4 kg)    General appearance: alert, cooperative and appears stated age Ears: normal TM's and external ear canals both ears Throat: lips, mucosa, and tongue normal; teeth and gums normal Axilla:  Left axilla with 2 nonfluctuant boils.  Neck: no adenopathy, no carotid bruit, supple, symmetrical, trachea midline and thyroid not enlarged, symmetric, no tenderness/mass/nodules Back: symmetric, no curvature. ROM normal. No CVA  tenderness. Lungs: clear to auscultation bilaterally Heart: regular rate and rhythm, S1, S2 normal, no murmur, click, rub or gallop Abdomen: soft, non-tender; bowel sounds normal; no masses,  no organomegaly Pulses: 2+ and symmetric Skin: Skin color, texture, turgor normal. No rashes or lesions Lymph nodes: Cervical, supraclavicular, and axillary nodes normal.  Lab Results  Component Value Date   HGBA1C 4.8 03/20/2018    Lab Results  Component Value Date   CREATININE 1.23 10/01/2019   CREATININE 1.16  08/05/2019   CREATININE 1.39 (H) 04/13/2018    Lab Results  Component Value Date   WBC 5.6 10/01/2019   HGB 16.8 10/01/2019   HCT 46.4 10/01/2019   PLT 203 10/01/2019   GLUCOSE 93 10/01/2019   CHOL 231 (H) 10/01/2019   TRIG 164 (H) 10/01/2019   HDL 61 10/01/2019   LDLCALC 141 (H) 10/01/2019   ALT 26 10/01/2019   AST 20 10/01/2019   NA 141 10/01/2019   K 4.8 10/01/2019   CL 107 (H) 10/01/2019   CREATININE 1.23 10/01/2019   BUN 14 10/01/2019   CO2 24 04/13/2018   TSH 1.490 10/01/2019   PSA 0.9 09/29/2017   HGBA1C 4.8 03/20/2018    No results found.  Assessment & Plan:   Problem List Items Addressed This Visit      Unprioritized   Cough in adult - Primary    Present since his COVID negative respiratory infection in June.   Chest x ray today is normal.  Cough is aggravated by activity and cold temperature. Trial of albuterol       Relevant Orders   DG Chest 2 View (Completed)   Furuncle of axillary fold    Empiric coverage of staph aureus with Septra DS .Marland Kitchen advised to apply hot compresses for 15 minutes every 6 hours to encourage drainage And call for surgical consult to I & D if it becomes tense /fluctuant and painful.  3 weeks of  probiotic advised.       Relevant Medications   sulfamethoxazole-trimethoprim (BACTRIM DS) 800-160 MG tablet   Insomnia due to anxiety and fear    Had a blackout on day 2 of ambien.  Now using trazodone. Rare use of xanax. For daytime anxiety,  refilled today.         I have changed Justin Jefferson's ALPRAZolam. I am also having him start on sulfamethoxazole-trimethoprim. Additionally, I am having him maintain his meloxicam and traZODone.  Meds ordered this encounter  Medications  . ALPRAZolam (XANAX) 0.25 MG tablet    Sig: Take 1 tablet (0.25 mg total) by mouth 2 (two) times daily as needed for anxiety.    Dispense:  45 tablet    Refill:  1  . sulfamethoxazole-trimethoprim (BACTRIM DS) 800-160 MG tablet    Sig: Take 1 tablet by  mouth 2 (two) times daily.    Dispense:  14 tablet    Refill:  0    There are no discontinued medications.  Follow-up: No follow-ups on file.   Sherlene Shams, MD

## 2020-04-24 MED ORDER — ALBUTEROL SULFATE HFA 108 (90 BASE) MCG/ACT IN AERS: 2.0000 | INHALATION_SPRAY | Freq: Four times a day (QID) | RESPIRATORY_TRACT | 11 refills | Status: DC | PRN

## 2020-04-25 DIAGNOSIS — R059 Cough, unspecified: Secondary | ICD-10-CM | POA: Insufficient documentation

## 2020-04-25 DIAGNOSIS — L02429 Furuncle of limb, unspecified: Secondary | ICD-10-CM | POA: Insufficient documentation

## 2020-04-25 NOTE — Assessment & Plan Note (Addendum)
Present since his COVID negative respiratory infection in June.   Chest x ray today is normal.  Cough is aggravated by activity and cold temperature. Trial of albuterol

## 2020-04-25 NOTE — Assessment & Plan Note (Addendum)
Empiric coverage of staph aureus with Septra DS .Marland Kitchen advised to apply hot compresses for 15 minutes every 6 hours to encourage drainage And call for surgical consult to I & D if it becomes tense /fluctuant and painful.  3 weeks of  probiotic advised.

## 2020-04-25 NOTE — Assessment & Plan Note (Signed)
Had a blackout on day 2 of ambien.  Now using trazodone. Rare use of xanax. For daytime anxiety,  refilled today.

## 2020-05-12 DIAGNOSIS — R972 Elevated prostate specific antigen [PSA]: Secondary | ICD-10-CM | POA: Diagnosis not present

## 2020-05-12 DIAGNOSIS — K4090 Unilateral inguinal hernia, without obstruction or gangrene, not specified as recurrent: Secondary | ICD-10-CM | POA: Diagnosis not present

## 2020-06-10 ENCOUNTER — Other Ambulatory Visit: Payer: Self-pay | Admitting: Internal Medicine

## 2020-06-10 MED ORDER — SULFAMETHOXAZOLE-TRIMETHOPRIM 800-160 MG PO TABS: 1.0000 | ORAL_TABLET | Freq: Two times a day (BID) | ORAL | 0 refills | Status: DC

## 2020-06-11 MED ORDER — SULFAMETHOXAZOLE-TRIMETHOPRIM 800-160 MG PO TABS: 1.0000 | ORAL_TABLET | Freq: Two times a day (BID) | ORAL | 0 refills | Status: DC

## 2020-06-11 NOTE — Telephone Encounter (Signed)
Pt wife called back with the pharmacy [facility] in [address] [place]

## 2020-09-27 ENCOUNTER — Ambulatory Visit: Payer: Self-pay

## 2020-09-27 ENCOUNTER — Other Ambulatory Visit: Payer: Self-pay

## 2020-09-27 DIAGNOSIS — Z01818 Encounter for other preprocedural examination: Secondary | ICD-10-CM

## 2020-09-27 LAB — POCT URINALYSIS DIPSTICK
Bilirubin, UA: NEGATIVE
Blood, UA: NEGATIVE
Glucose, UA: NEGATIVE
Ketones, UA: NEGATIVE
Leukocytes, UA: NEGATIVE
Nitrite, UA: NEGATIVE
Protein, UA: NEGATIVE
Spec Grav, UA: 1.025 (ref 1.010–1.025)
Urobilinogen, UA: 0.2 E.U./dL
pH, UA: 6 (ref 5.0–8.0)

## 2020-09-27 NOTE — Progress Notes (Signed)
Pt scheduled to complete physical 10/04/20.  CL,RMA

## 2020-09-28 LAB — CMP12+LP+TP+TSH+6AC+PSA+CBC…
ALT: 25 IU/L (ref 0–44)
AST: 20 IU/L (ref 0–40)
Albumin/Globulin Ratio: 2 (ref 1.2–2.2)
Albumin: 4.6 g/dL (ref 4.0–5.0)
Alkaline Phosphatase: 98 IU/L (ref 44–121)
BUN/Creatinine Ratio: 11 (ref 9–20)
BUN: 14 mg/dL (ref 6–24)
Basophils Absolute: 0.1 10*3/uL (ref 0.0–0.2)
Basos: 1 %
Bilirubin Total: 0.6 mg/dL (ref 0.0–1.2)
Calcium: 9.3 mg/dL (ref 8.7–10.2)
Chloride: 103 mmol/L (ref 96–106)
Chol/HDL Ratio: 4.8 ratio (ref 0.0–5.0)
Cholesterol, Total: 262 mg/dL — ABNORMAL HIGH (ref 100–199)
Creatinine, Ser: 1.24 mg/dL (ref 0.76–1.27)
EOS (ABSOLUTE): 0.3 10*3/uL (ref 0.0–0.4)
Eos: 5 %
Estimated CHD Risk: 1 times avg. (ref 0.0–1.0)
Free Thyroxine Index: 1.8 (ref 1.2–4.9)
GFR calc Af Amer: 78 mL/min/{1.73_m2} (ref >59)
GFR calc non Af Amer: 67 mL/min/{1.73_m2} (ref >59)
GGT: 67 IU/L — ABNORMAL HIGH (ref 0–65)
Globulin, Total: 2.3 g/dL (ref 1.5–4.5)
Glucose: 95 mg/dL (ref 65–99)
HDL: 55 mg/dL (ref >39)
Hematocrit: 51.5 % — ABNORMAL HIGH (ref 37.5–51.0)
Hemoglobin: 18 g/dL — ABNORMAL HIGH (ref 13.0–17.7)
Immature Grans (Abs): 0 10*3/uL (ref 0.0–0.1)
Immature Granulocytes: 0 %
Iron: 155 ug/dL (ref 38–169)
LDH: 200 IU/L (ref 121–224)
LDL Chol Calc (NIH): 171 mg/dL — ABNORMAL HIGH (ref 0–99)
Lymphocytes Absolute: 2 10*3/uL (ref 0.7–3.1)
Lymphs: 34 %
MCH: 33.2 pg — ABNORMAL HIGH (ref 26.6–33.0)
MCHC: 35 g/dL (ref 31.5–35.7)
MCV: 95 fL (ref 79–97)
Monocytes Absolute: 0.4 10*3/uL (ref 0.1–0.9)
Monocytes: 8 %
Neutrophils Absolute: 3 10*3/uL (ref 1.4–7.0)
Neutrophils: 52 %
Phosphorus: 3.1 mg/dL (ref 2.8–4.1)
Platelets: 175 10*3/uL (ref 150–450)
Potassium: 4.4 mmol/L (ref 3.5–5.2)
Prostate Specific Ag, Serum: 0.8 ng/mL (ref 0.0–4.0)
RBC: 5.42 x10E6/uL (ref 4.14–5.80)
RDW: 12 % (ref 11.6–15.4)
Sodium: 139 mmol/L (ref 134–144)
T3 Uptake Ratio: 28 % (ref 24–39)
T4, Total: 6.4 ug/dL (ref 4.5–12.0)
TSH: 1.71 u[IU]/mL (ref 0.450–4.500)
Total Protein: 6.9 g/dL (ref 6.0–8.5)
Triglycerides: 194 mg/dL — ABNORMAL HIGH (ref 0–149)
Uric Acid: 7.1 mg/dL (ref 3.8–8.4)
VLDL Cholesterol Cal: 36 mg/dL (ref 5–40)
WBC: 5.7 10*3/uL (ref 3.4–10.8)

## 2020-10-01 NOTE — Addendum Note (Signed)
Addended by: Gardner Candle on: 10/01/2020 04:29 PM   Modules accepted: Orders

## 2020-10-04 ENCOUNTER — Ambulatory Visit: Payer: Self-pay | Admitting: Adult Medicine

## 2020-10-04 ENCOUNTER — Other Ambulatory Visit: Payer: Self-pay

## 2020-10-04 VITALS — HR 69 | Temp 98.3°F | Resp 14 | Ht 70.0 in | Wt 210.0 lb

## 2020-10-04 DIAGNOSIS — Z0000 Encounter for general adult medical examination without abnormal findings: Secondary | ICD-10-CM

## 2020-10-04 NOTE — Progress Notes (Addendum)
HISTORY  Chief Complaint  Annual Exam No chief complaint on file.  HPI Justin Jefferson is a 50 y.o. male   Well visit without concerns eg anxiety, arthritis  Past Medical history Review of yr prior audiogram reveal upper frequency  Likely age related decline no yet clinical significant  nonsmoker  Etoh intermittent social 2-4 beers no more then  5 day/wk  Past Medical History:  Diagnosis Date  . Anxiety   . Arthritis     Patient Active Problem List   Diagnosis Date Noted  . Furuncle of axillary fold 04/25/2020  . Cough in adult 04/25/2020  . Acquired trigger finger of right ring finger 09/12/2018  . Trigger finger of right hand 09/03/2018  . H/O total shoulder replacement, left 04/12/2018  . Impaired fasting glucose 03/25/2018  . Elevated blood-pressure reading without diagnosis of hypertension 03/16/2018  . Ocular migraine 03/16/2018  . S/p bilateral carpal tunnel release 03/16/2018  . DJD of left shoulder 03/16/2018  . Insomnia due to anxiety and fear 03/16/2018  . Osteoarthritis of left glenohumeral joint 08/29/2017    Past Surgical History:  Procedure Laterality Date  . CARPAL TUNNEL RELEASE Right 2016   [facility]  . LUMBAR LAMINECTOMY/DECOMPRESSION MICRODISCECTOMY  1995   [facility]  . NASAL SINUS SURGERY  2000  . OPEN ANTERIOR SHOULDER RECONSTRUCTION Left 1987   secondary to football dislocation  . REVERSE SHOULDER ARTHROPLASTY Left 04/12/2018   Procedure: LEFT SHOULDER ANATOMOC VS. REVERSE SHOULDER ARTHROPLASTY;  Surgeon: Beverely Low, MD;  Location: [facility] OR;  Service: Orthopedics;  Laterality: Left;  . shoulder replacement Left 04/12/2018   Anatomical    Prior to Admission medications   Medication Sig Start Date End Date Taking? Authorizing Provider  albuterol (VENTOLIN HFA) 108 (90 Base) MCG/ACT inhaler Inhale 2 puffs into the lungs every 6 (six) hours as needed for wheezing or shortness of breath. 04/24/20   Sherlene Shams, MD   ALPRAZolam Prudy Feeler) 0.25 MG tablet Take 1 tablet (0.25 mg total) by mouth 2 (two) times daily as needed for anxiety. 04/23/20   Sherlene Shams, MD  meloxicam (MOBIC) 15 MG tablet Take 15 mg by mouth daily. with food 03/04/18   [provider]  sulfamethoxazole-trimethoprim (BACTRIM DS) 800-160 MG tablet Take 1 tablet by mouth 2 (two) times daily. 06/10/20   Sherlene Shams, MD  sulfamethoxazole-trimethoprim (BACTRIM DS) 800-160 MG tablet Take 1 tablet by mouth 2 (two) times daily. 06/11/20   Sherlene Shams, MD  traZODone (DESYREL) 50 MG tablet TAKE 1 TABLET BY MOUTH EVERYDAY AT BEDTIME 04/20/20   Sherlene Shams, MD    Allergies Ambien [zolpidem tartrate]  Family History  Problem Relation Age of Onset  . Diabetes Mother   . Hypertension Mother   . Hyperlipidemia Mother   . Hearing loss Mother   . Cancer Father 27       prostate  . Depression Father   . Hypertension Father   . Parkinson's disease Father   . Arthritis Maternal Grandmother   . Diabetes Maternal Grandmother   . Early death Maternal Grandfather   . Heart disease Maternal Grandfather   . Hypertension Maternal Grandfather   . Heart attack Maternal Grandfather   . Stroke Maternal Grandfather   . Hearing loss Paternal Grandmother   . Arthritis Paternal Grandfather   . COPD Paternal Grandfather   . Heart attack Paternal Grandfather   . Hypertension Paternal Grandfather     Social History Social History  Tobacco Use  . Smoking status: Never Smoker  . Smokeless tobacco: Never Used  Vaping Use  . Vaping Use: Never used  Substance Use Topics  . Alcohol use: Yes    Alcohol/week: 35.0 standard drinks    Types: 14 Cans of beer, 21 Shots of liquor per week    Comment: averages 2 beers,   2hots of bourbon nightly   . Drug use: Never    Review of Systems Constitutional: No fever/chills Eyes: No visual changes. ENT: No sore throat. Cardiovascular: Denies chest pain. Respiratory: Denies shortness of  breath. Gastrointestinal: No abdominal pain.  No nausea, no vomiting.  No diarrhea.  No constipation. Genitourinary: Negative for dysuria. Musculoskeletal: Negative for back pain. Skin: Negative for rash. Neurological: Negative for headaches, focal weakness or numbness. ____________________________________________   PHYSICAL EXAM: Bmi 30%  bp 148/89 VITAL SIGNS: Constitutional: Alert and oriented. Well appearing and in no acute distress. Eyes: Conjunctivae are normal. PERRL. EOMI. Head: Atraumatic. Nose: No congestion/rhinnorhea. Mouth/Throat: Mucous membranes are moist.  Oropharynx non-erythematous. Neck: No stridor.  No cervical spine tenderness to palpation No cervical lymphadenopathy. Cardiovascular: Normal rate, regular rhythm. Grossly normal heart sounds.  Good peripheral circulation. Respiratory: Normal respiratory effort.  No retractions. Lungs CTAB. Gastrointestinal: Soft and nontender. Mild  nontender distention. No abdominal bruits. No CVA tenderness. Genitourinary: 2 scrotal nl size testes, prostrate<3x3 nontender nodules Musculoskeletal: No lower extremity tenderness nor edema.  No joint effusions. Neurologic:  Normal speech and language. No gross focal neurologic deficits are appreciated. No gait instability. Skin:  Skin is warm, dry and intact. No rash noted. 5in scar lt shoulder Psychiatric: Mood and affect are normal. Speech and behavior are normal.  LABS Glucose 95 mg/dL Chol/HDL Ratio 4.8 ratio   Uric Acid 7.1 mg/dL  Estimated CHD Risk 1.0 times avg.   BUN 14 mg/dL TSH 3.007 uIU/mL  Creatinine, Ser 1.24 mg/dL T4, Total 6.4 ug/dL  GFR calc non Af Amer 67 mL/min/1.73 T3 Uptake Ratio 28 %  GFR calc Af Amer 78 mL/min/1.73  Free Thyroxine Index 1.8  BUN/Creatinine Ratio 11 Prostate Specific Ag, Serum 0.8 ng/mL   Sodium 139 mmol/L WBC 5.7 x10E3/uL  Potassium 4.4 mmol/L RBC 5.42 x10E6/uL  Chloride 103 mmol/L Hemoglobin 18.0High g/dL  Calcium 9.3 mg/dL Hematocrit  62.2QJFH %  Phosphorus 3.1 mg/dL MCV 95 fL  Total Protein 6.9 g/dL MCH 33.2High pg  Albumin 4.6 g/dL MCHC 54.5 g/dL  Globulin, Total 2.3 g/dL RDW 62.5 %  Albumin/Globulin Ratio 2.0 Platelets 175 x10E3/uL  Bilirubin Total 0.6 mg/dL Neutrophils 52 %  Alkaline Phosphatase 98 IU/L Lymphs 34 %  LDH 200 IU/L Monocytes 8 %  AST 20 IU/L Eos 5 %  ALT 25 IU/L Basos 1 %  GGT 67High IU/L Neutrophils Absolute 3.0 x10E3/uL  Iron 155 ug/dL Lymphocytes Absolute 2.0 x10E3/uL  Cholesterol, Total 262High mg/dL Monocytes Absolute 0.4 x10E3/uL  Triglycerides 194High mg/dL EOS (ABSOLUTE) 0.3 W38L3/TD  HDL 55 mg/dL Basophils Absolute 0.1 x10E3/uL  VLDL Cholesterol Cal 36 mg/dL Immature Granulocytes 0 %  LDL Chol Calc (NIH) 171High mg/dL     UA SG 4287 ____________________________________________  EKG  No acute changes consider Laa. 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 Client aware and is ok with making some adjustments

## 2020-10-15 ENCOUNTER — Other Ambulatory Visit: Payer: Self-pay | Admitting: Internal Medicine

## 2020-11-05 ENCOUNTER — Other Ambulatory Visit: Payer: Self-pay | Admitting: Physician Assistant

## 2020-11-05 ENCOUNTER — Ambulatory Visit: Payer: Self-pay

## 2020-11-05 ENCOUNTER — Telehealth: Payer: Self-pay

## 2020-11-05 ENCOUNTER — Other Ambulatory Visit: Payer: Self-pay

## 2020-11-05 VITALS — BP 138/90 | Temp 98.8°F

## 2020-11-05 DIAGNOSIS — L723 Sebaceous cyst: Secondary | ICD-10-CM

## 2020-11-05 MED ORDER — DOXYCYCLINE MONOHYDRATE 100 MG PO CAPS: 100.0000 mg | ORAL_CAPSULE | Freq: Two times a day (BID) | ORAL | 0 refills | Status: DC

## 2020-11-05 MED ORDER — TRAMADOL HCL 50 MG PO TABS: 50.0000 mg | ORAL_TABLET | Freq: Three times a day (TID) | ORAL | 0 refills | Status: AC | PRN

## 2020-11-05 MED ORDER — NAPROXEN 500 MG PO TABS: 500.0000 mg | ORAL_TABLET | Freq: Two times a day (BID) | ORAL | Status: DC

## 2020-11-05 NOTE — Telephone Encounter (Signed)
Presents to [facility] for evaluation.  Reddened & raised (dime sized) cysts in each armpit. Painful Came up as red spots about a week ago & progressed. Has been putting Neosporin on them  Contact Ron Katrinka Blazing, PA-C [facility] Interim Provider) Reviewed above information. Schedule follow-up appt for Monday Orders received: 1.  Doxycycline 100 mg bid x10 days #20 No Refills 2.  Naproxen 500 mg bid x10 days #20 No Refills 3. Tramadol 50 mg 1 tab q 8 hr prn#12 No Refills 4.  Warm compresses 15 minutes bid 5.  Don't press or squeeze 6.  If starts leaking apply gauze.  Will put above medications in to Epic & send to Ron.  Above reviewed with Feliz Beam & appt scheduled.  AMD

## 2020-11-08 ENCOUNTER — Ambulatory Visit: Payer: Self-pay | Admitting: Physician Assistant

## 2020-11-08 ENCOUNTER — Encounter: Payer: Self-pay | Admitting: Physician Assistant

## 2020-11-08 ENCOUNTER — Other Ambulatory Visit: Payer: Self-pay

## 2020-11-08 VITALS — BP 140/84 | HR 63 | Temp 98.1°F | Resp 12

## 2020-11-08 DIAGNOSIS — L723 Sebaceous cyst: Secondary | ICD-10-CM

## 2020-11-08 NOTE — Progress Notes (Signed)
   Subjective: Sebaceous cyst    Patient ID: Justin Jefferson, male    DOB: 02-24-1971, 50 y.o.   MRN: [id number]  HPI Patient presents for reevaluation patient states.  Patient said lesion ruptured yesterday resulting in decreased swelling and pain.  Patient continue antibiotics and anti-inflammatory medication as directed.  Patient stated only need to use pain medication twice since he got the prescription.  Denies fever associated complaint.   Review of Systems    Anxiety, asthma, and insomnia. Objective:   Physical Exam No acute distress BP is 140/84, pulse 63, respiration 20, temperature 98.1, patient is 99% O2 sat on room air. Evaluation of the bilateral axillary area reveals papular lesion on erythematous base.  No active drainage.  Nontender to palpation at this time.       Assessment & Plan: Sebaceous cyst  Resolving sebaceous cyst bilateral axillary area.  Patient advised to continue previous medications to finish therapy.  Advised use antibacterial soap and return back if condition worsens.

## 2020-11-08 NOTE — Progress Notes (Signed)
Stopped the Mobic while taking the Naproxen  Used the Tramadol a couple of times over the weekend.  States both cysts ruptured on Saturday.

## 2021-02-24 ENCOUNTER — Telehealth: Payer: Self-pay

## 2021-02-24 NOTE — Telephone Encounter (Signed)
[name] called and made appt for husband for 03/16/21. 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 He states that he may see the doctor through the city sooner since it is high but wants to keep appt with Dr Darrick Huntsman as well

## 2021-02-24 NOTE — Telephone Encounter (Signed)
Pt was scheduled for first available on July 27th.

## 2021-02-25 MED ORDER — AMLODIPINE BESYLATE 5 MG PO TABS: 5.0000 mg | ORAL_TABLET | Freq: Every day | ORAL | 1 refills | Status: DC

## 2021-02-25 NOTE — Telephone Encounter (Signed)
Tell him to start taking amlodipine 5 mg daily.  Rx sent. Keep appt July 27

## 2021-02-25 NOTE — Telephone Encounter (Signed)
Spoke with pt's wife and she stated that the pt went to see the nurse at his job and she recommended to the pt a couple of things to hep with his bp. She told him to loosen his belt around his waist, and sit up straight at his desk rather than leaning on it. Wife stated that after doing that by the time he got home yesterday evening his bp was down to normal range of 120/70. She stated that they are going to not start the amlodipine for now and keep track of his blood pressure until his appt with you on 03/16/2021.

## 2021-03-16 ENCOUNTER — Ambulatory Visit (INDEPENDENT_AMBULATORY_CARE_PROVIDER_SITE_OTHER): Payer: 59 | Admitting: Internal Medicine

## 2021-03-16 ENCOUNTER — Other Ambulatory Visit: Payer: Self-pay

## 2021-03-16 ENCOUNTER — Encounter: Payer: Self-pay | Admitting: Internal Medicine

## 2021-03-16 VITALS — BP 156/90 | HR 63 | Temp 97.0°F | Ht 70.0 in | Wt 212.2 lb

## 2021-03-16 DIAGNOSIS — I10 Essential (primary) hypertension: Secondary | ICD-10-CM | POA: Diagnosis not present

## 2021-03-16 LAB — MICROALBUMIN / CREATININE URINE RATIO
Creatinine,U: 96.3 mg/dL
Microalb Creat Ratio: 0.7 mg/g (ref 0.0–30.0)
Microalb, Ur: <0.7 mg/dL (ref 0.0–1.9)

## 2021-03-16 NOTE — Progress Notes (Signed)
Subjective:  Patient ID: Justin Jefferson, male    DOB: [birth year]  Age: 50 y.o. MRN: [id number]  CC: The primary encounter diagnosis was Elevated blood pressure reading in office with diagnosis of hypertension. A diagnosis of White coat syndrome with diagnosis of hypertension was also pertinent to this visit.  HPI ANIKETH HUBERTY presents for follow up on elevated blood pressure readings  This visit occurred during the SARS-CoV-2 public health emergency.  Safety protocols were in place, including screening questions prior to the visit, additional usage of staff PPE, and extensive cleaning of exam room while observing appropriate contact time as indicated for disinfecting solutions.    HTN:  high readings reported by the [facility] , with most recent 140/100.  He has picked up but has not  been taking amlodipine that was called in on July 8 because home readings have been <140/90 .  He uses meloxicam daily,  does not smoke,  has 2-3 drinks per night and cites increased aggravation and stress at work. HOme readings have been in th erange of 130/90 or less,  124/86 most recently. Diastolics have been >80 consistently    Outpatient Medications Prior to Visit  Medication Sig Dispense Refill   albuterol (VENTOLIN HFA) 108 (90 Base) MCG/ACT inhaler Inhale 2 puffs into the lungs every 6 (six) hours as needed for wheezing or shortness of breath. 3.7 g 11   ALPRAZolam (XANAX) 0.25 MG tablet Take 1 tablet (0.25 mg total) by mouth 2 (two) times daily as needed for anxiety. 45 tablet 1   meloxicam (MOBIC) 15 MG tablet Take 15 mg by mouth daily.     traZODone (DESYREL) 50 MG tablet TAKE 1 TABLET BY MOUTH EVERYDAY AT BEDTIME 90 tablet 1   amLODipine (NORVASC) 5 MG tablet Take 1 tablet (5 mg total) by mouth daily. (Patient not taking: Reported on 03/16/2021) 90 tablet 1   doxycycline (MONODOX) 100 MG capsule Take 1 capsule (100 mg total) by mouth 2 (two) times daily. (Patient not taking: Reported  on 03/16/2021) 20 capsule 0   No facility-administered medications prior to visit.    Review of Systems;  Patient denies headache, fevers, malaise, unintentional weight loss, skin rash, eye pain, sinus congestion and sinus pain, sore throat, dysphagia,  hemoptysis , cough, dyspnea, wheezing, chest pain, palpitations, orthopnea, edema, abdominal pain, nausea, melena, diarrhea, constipation, flank pain, dysuria, hematuria, urinary  Frequency, nocturia, numbness, tingling, seizures,  Focal weakness, Loss of consciousness,  Tremor, insomnia, depression, anxiety, and suicidal ideation.      Objective:  BP (!) 156/90 (BP Location: Left Arm, Patient Position: Sitting)   Pulse 63   Temp (!) 97 F (36.1 C)   Ht 5\' 10"  (1.778 m)   Wt 212 lb 3.2 oz (96.3 kg)   SpO2 99%   BMI 30.45 kg/m   BP Readings from Last 3 Encounters:  03/16/21 (!) 156/90  11/08/20 140/84  11/05/20 138/90    Wt Readings from Last 3 Encounters:  03/16/21 212 lb 3.2 oz (96.3 kg)  10/04/20 210 lb (95.3 kg)  04/23/20 210 lb 9.6 oz (95.5 kg)    General appearance: alert, cooperative and appears stated age Ears: normal TM's and external ear canals both ears Throat: lips, mucosa, and tongue normal; teeth and gums normal Neck: no adenopathy, no carotid bruit, supple, symmetrical, trachea midline and thyroid not enlarged, symmetric, no tenderness/mass/nodules Back: symmetric, no curvature. ROM normal. No CVA tenderness. Lungs: clear to auscultation bilaterally Heart: regular  rate and rhythm, S1, S2 normal, no murmur, click, rub or gallop Abdomen: soft, non-tender; bowel sounds normal; no masses,  no organomegaly Pulses: 2+ and symmetric Skin: Skin color, texture, turgor normal. No rashes or lesions Lymph nodes: Cervical, supraclavicular, and axillary nodes normal.  Lab Results  Component Value Date   HGBA1C 4.8 03/20/2018    Lab Results  Component Value Date   CREATININE 1.24 09/27/2020   CREATININE 1.23  10/01/2019   CREATININE 1.16 08/05/2019    Lab Results  Component Value Date   WBC 5.7 09/27/2020   HGB 18.0 (H) 09/27/2020   HCT 51.5 (H) 09/27/2020   PLT 175 09/27/2020   GLUCOSE 95 09/27/2020   CHOL 262 (H) 09/27/2020   TRIG 194 (H) 09/27/2020   HDL 55 09/27/2020   LDLCALC 171 (H) 09/27/2020   ALT 25 09/27/2020   AST 20 09/27/2020   NA 139 09/27/2020   K 4.4 09/27/2020   CL 103 09/27/2020   CREATININE 1.24 09/27/2020   BUN 14 09/27/2020   CO2 24 04/13/2018   TSH 1.710 09/27/2020   PSA 0.9 09/29/2017   HGBA1C 4.8 03/20/2018    No results found.  Assessment & Plan:   Problem List Items Addressed This Visit       Unprioritized   White coat syndrome with diagnosis of hypertension    Advised to suspend nsaid daily use for one week and reassess a home.  If BP is > 130/80  Starting Amlodipine 2.5 mg recommended . Advised to reduce use of NSAIDs to prn , not daily, for acute pain .checking microalb/cr ratio        Other Visit Diagnoses     Elevated blood pressure reading in office with diagnosis of hypertension    -  Primary   Relevant Orders   Microalbumin / creatinine urine ratio     A total of 30 minutes was spent with patient more than half of which was spent in counseling patient on his BP readings, lifestyle,  use of medications  reviewing and explaining recent labs and imaging studies done, and coordination of care.`  I am having Loree Fee. Galla maintain his ALPRAZolam, albuterol, traZODone, doxycycline, amLODipine, and meloxicam.  No orders of the defined types were placed in this encounter.   There are no discontinued medications.  Follow-up: No follow-ups on file.   Sherlene Shams, MD

## 2021-03-16 NOTE — Assessment & Plan Note (Addendum)
Advised to suspend nsaid daily use for one week and reassess a home.  If BP is > 130/80  Starting Amlodipine 2.5 mg recommended . Advised to reduce use of NSAIDs to prn , not daily, for acute pain .checking microalb/cr ratio

## 2021-03-16 NOTE — Patient Instructions (Addendum)
A normal blood pressure is 120/70 to 130/80  .  Several factors may be affecting your readings:  NSAIDS Caffeine Aggravation tobacco    Suspend the meloxicam for up to one week and use tylenol 1000 mg every 8 hours If needed for pain   Check you BP at home while off of the meloxicam.  IF  BP  < 130/80, You can continue tylenol long term  1000mg  two times daily  and no need to start amlodipine   IF there is NO CHANGE  IN BP while off of meloxicam,  (stil > 130/80) ok to resume if needed  but better to use it less than daily and rely on tylenol for daily use     If BP is still higher than  130/80  ,  start  2.5 mg amlodipine daily  and reassess BP in one week    Daily exercise and weight loss will help joints and BP

## 2021-03-26 ENCOUNTER — Other Ambulatory Visit: Payer: Self-pay | Admitting: Internal Medicine

## 2021-03-30 ENCOUNTER — Other Ambulatory Visit: Payer: Self-pay | Admitting: Physician Assistant

## 2021-03-30 DIAGNOSIS — L723 Sebaceous cyst: Secondary | ICD-10-CM

## 2021-03-30 MED ORDER — DOXYCYCLINE MONOHYDRATE 100 MG PO CAPS: 100.0000 mg | ORAL_CAPSULE | Freq: Two times a day (BID) | ORAL | 0 refills | Status: DC

## 2021-04-05 ENCOUNTER — Other Ambulatory Visit: Payer: Self-pay

## 2021-04-05 ENCOUNTER — Ambulatory Visit: Payer: Self-pay

## 2021-04-05 VITALS — BP 149/88 | HR 55

## 2021-04-05 DIAGNOSIS — Z0130 Encounter for examination of blood pressure without abnormal findings: Secondary | ICD-10-CM

## 2021-04-05 NOTE — Progress Notes (Signed)
Pt presents today for BP check by wanda smith,RN  CL,RMA

## 2021-08-03 ENCOUNTER — Other Ambulatory Visit: Payer: Self-pay | Admitting: Internal Medicine

## 2021-10-13 ENCOUNTER — Other Ambulatory Visit: Payer: Self-pay

## 2021-10-13 ENCOUNTER — Ambulatory Visit: Payer: Self-pay

## 2021-10-13 DIAGNOSIS — Z0000 Encounter for general adult medical examination without abnormal findings: Secondary | ICD-10-CM

## 2021-10-13 DIAGNOSIS — Z0289 Encounter for other administrative examinations: Secondary | ICD-10-CM

## 2021-10-13 LAB — POCT URINALYSIS DIPSTICK
Bilirubin, UA: NEGATIVE
Glucose, UA: NEGATIVE
Ketones, UA: NEGATIVE
Leukocytes, UA: NEGATIVE
Nitrite, UA: NEGATIVE
Protein, UA: NEGATIVE
Spec Grav, UA: 1.015 (ref 1.010–1.025)
Urobilinogen, UA: 0.2 E.U./dL
pH, UA: 6 (ref 5.0–8.0)

## 2021-10-13 NOTE — Progress Notes (Signed)
10/20/21 annual physical scheduled.

## 2021-10-14 LAB — CMP12+LP+TP+TSH+6AC+PSA+CBC…
ALT: 22 IU/L (ref 0–44)
AST: 18 IU/L (ref 0–40)
Albumin/Globulin Ratio: 2.1 (ref 1.2–2.2)
Albumin: 4.5 g/dL (ref 3.8–4.9)
Alkaline Phosphatase: 100 IU/L (ref 44–121)
BUN/Creatinine Ratio: 8 — ABNORMAL LOW (ref 9–20)
BUN: 11 mg/dL (ref 6–24)
Basophils Absolute: 0.1 10*3/uL (ref 0.0–0.2)
Basos: 1 %
Bilirubin Total: 0.7 mg/dL (ref 0.0–1.2)
Calcium: 9.4 mg/dL (ref 8.7–10.2)
Chloride: 102 mmol/L (ref 96–106)
Chol/HDL Ratio: 4.6 ratio (ref 0.0–5.0)
Cholesterol, Total: 254 mg/dL — ABNORMAL HIGH (ref 100–199)
Creatinine, Ser: 1.35 mg/dL — ABNORMAL HIGH (ref 0.76–1.27)
EOS (ABSOLUTE): 0.2 10*3/uL (ref 0.0–0.4)
Eos: 3 %
Estimated CHD Risk: 0.9 times avg. (ref 0.0–1.0)
Free Thyroxine Index: 1.6 (ref 1.2–4.9)
GGT: 61 IU/L (ref 0–65)
Globulin, Total: 2.1 g/dL (ref 1.5–4.5)
Glucose: 94 mg/dL (ref 70–99)
HDL: 55 mg/dL (ref >39)
Hematocrit: 48.2 % (ref 37.5–51.0)
Hemoglobin: 16.8 g/dL (ref 13.0–17.7)
Immature Grans (Abs): 0 10*3/uL (ref 0.0–0.1)
Immature Granulocytes: 0 %
Iron: 108 ug/dL (ref 38–169)
LDH: 177 IU/L (ref 121–224)
LDL Chol Calc (NIH): 162 mg/dL — ABNORMAL HIGH (ref 0–99)
Lymphocytes Absolute: 1.4 10*3/uL (ref 0.7–3.1)
Lymphs: 29 %
MCH: 33.2 pg — ABNORMAL HIGH (ref 26.6–33.0)
MCHC: 34.9 g/dL (ref 31.5–35.7)
MCV: 95 fL (ref 79–97)
Monocytes Absolute: 0.4 10*3/uL (ref 0.1–0.9)
Monocytes: 8 %
Neutrophils Absolute: 2.9 10*3/uL (ref 1.4–7.0)
Neutrophils: 59 %
Phosphorus: 3.1 mg/dL (ref 2.8–4.1)
Platelets: 177 10*3/uL (ref 150–450)
Potassium: 4.5 mmol/L (ref 3.5–5.2)
Prostate Specific Ag, Serum: 0.8 ng/mL (ref 0.0–4.0)
RBC: 5.06 x10E6/uL (ref 4.14–5.80)
RDW: 12.5 % (ref 11.6–15.4)
Sodium: 140 mmol/L (ref 134–144)
T3 Uptake Ratio: 26 % (ref 24–39)
T4, Total: 6.1 ug/dL (ref 4.5–12.0)
TSH: 1.63 u[IU]/mL (ref 0.450–4.500)
Total Protein: 6.6 g/dL (ref 6.0–8.5)
Triglycerides: 203 mg/dL — ABNORMAL HIGH (ref 0–149)
Uric Acid: 7.1 mg/dL (ref 3.8–8.4)
VLDL Cholesterol Cal: 37 mg/dL (ref 5–40)
WBC: 5 10*3/uL (ref 3.4–10.8)
eGFR: 63 mL/min/{1.73_m2} (ref >59)

## 2021-10-20 ENCOUNTER — Ambulatory Visit: Payer: Self-pay | Admitting: Physician Assistant

## 2021-10-20 ENCOUNTER — Other Ambulatory Visit: Payer: Self-pay

## 2021-10-20 ENCOUNTER — Encounter: Payer: Self-pay | Admitting: Physician Assistant

## 2021-10-20 VITALS — BP 130/90 | HR 68 | Temp 97.6°F | Resp 14 | Ht 70.0 in | Wt 210.0 lb

## 2021-10-20 DIAGNOSIS — Z1211 Encounter for screening for malignant neoplasm of colon: Secondary | ICD-10-CM

## 2021-10-20 DIAGNOSIS — Z0000 Encounter for general adult medical examination without abnormal findings: Secondary | ICD-10-CM

## 2021-10-20 DIAGNOSIS — I10 Essential (primary) hypertension: Secondary | ICD-10-CM

## 2021-10-20 DIAGNOSIS — E782 Mixed hyperlipidemia: Secondary | ICD-10-CM

## 2021-10-20 NOTE — Progress Notes (Signed)
[place] [facility]  ____________________________________________   None    (approximate)  I have reviewed the triage vital signs and the nursing notes.   HISTORY  Chief Complaint Employment Physical   HPI Justin Jefferson is a 51 y.o. male patient presents for annual physical exam.  Voices no concerns or complaints.  Patient has a history of "whitecoat" hypertension and insomnia secondary to anxiety.         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 REVERSE SHOULDER ARTHROPLASTY;  Surgeon: Netta Cedars, MD;  Location: [facility];  Service: Orthopedics;  Laterality: Left;   shoulder replacement Left 04/12/2018   Anatomical    Prior to Admission medications   Medication Sig Start Date End Date Taking? Authorizing Provider  ALPRAZolam (XANAX) 0.25 MG tablet Take 1 tablet (0.25 mg total) by mouth 2 (two) times daily as needed for anxiety. 04/23/20  Yes Crecencio Mc, MD   amLODipine (NORVASC) 5 MG tablet Take 1 tablet (5 mg total) by mouth daily. 02/25/21  Yes Crecencio Mc, MD  traZODone (DESYREL) 50 MG tablet TAKE 1 TABLET BY MOUTH EVERYDAY AT BEDTIME 08/03/21  Yes Crecencio Mc, MD    Allergies Ambien [zolpidem tartrate]  Family History  Problem Relation Age of Onset   Diabetes Mother    Hypertension Mother    Hyperlipidemia Mother    Hearing loss Mother    Cancer Father 22       prostate   Depression Father    Hypertension Father    Parkinson's disease Father    Arthritis Maternal Grandmother    Diabetes Maternal Grandmother    Early death Maternal Grandfather    Heart disease Maternal Grandfather    Hypertension Maternal Grandfather    Heart attack Maternal Grandfather    Stroke Maternal Grandfather    Hearing loss Paternal Grandmother    Arthritis Paternal Grandfather    COPD Paternal Grandfather    Heart attack Paternal Grandfather    Hypertension Paternal Grandfather     Social History Social History   Tobacco Use   Smoking status: Never   Smokeless tobacco: Never  Vaping Use   Vaping Use: Never used  Substance Use Topics   Alcohol use: Yes    Alcohol/week: 35.0 standard drinks    Types: 14 Cans of beer, 21 Shots of liquor per week    Comment: averages 2 beers,   2hots of bourbon nightly  Drug use: Never    Review of Systems Constitutional: No fever/chills Eyes: No visual changes. ENT: No sore throat. Cardiovascular: Denies chest pain. Respiratory: Denies shortness of breath. Gastrointestinal: No abdominal pain.  No nausea, no vomiting.  No diarrhea.  No constipation. Genitourinary: Negative for dysuria. Musculoskeletal: Negative for back pain. Skin: Negative for rash. Neurological: Negative for headaches, focal weakness or numbness. Psychiatric: Anxiety and insomnia. Endocrine: Hypertension Allergic/Immunilogical: Ambien  ____________________________________________   PHYSICAL EXAM:  VITAL  SIGNS: Constitutional: Alert and oriented. Well appearing and in no acute distress. Eyes: Conjunctivae are normal. PERRL. EOMI. Head: Atraumatic. Nose: No congestion/rhinnorhea. Mouth/Throat: Mucous membranes are moist.  Oropharynx non-erythematous. Neck: No stridor.  No cervical spine tenderness to palpation. Hematological/Lymphatic/Immunilogical: No cervical lymphadenopathy. Cardiovascular: Normal rate, regular rhythm. Grossly normal heart sounds.  Good peripheral circulation. Respiratory: Normal respiratory effort.  No retractions. Lungs CTAB. Gastrointestinal: Soft and nontender. No distention. No abdominal bruits. No CVA tenderness. Genitourinary: Deferred Musculoskeletal: No lower extremity tenderness nor edema.  No joint effusions. Neurologic:  Normal speech and language. No gross focal neurologic deficits are appreciated. No gait instability. Skin:  Skin is warm, dry and intact. No rash noted. Psychiatric: Mood and affect are normal. Speech and behavior are normal.  ____________________________________________   LABS 0 Result Notes           Component Ref Range & Units 7 d ago (10/13/21) 1 yr ago (09/27/20) 2 yr ago (10/01/19) 2 yr ago (08/05/19) 3 yr ago (04/13/18) 3 yr ago (04/13/18) 3 yr ago (04/04/18)  Glucose 70 - 99 mg/dL 94  95 R  93 R  76 R  122 High      Uric Acid 3.8 - 8.4 mg/dL 7.1  7.1 CM  8.2 CM  6.8 CM      Comment:            Therapeutic target for gout patients: <6.0  BUN 6 - 24 mg/dL _0 R     Creatinine, Ser 0.76 - 1.27 mg/dL 1.35 High   1.24  1.23  1.16  1.39 High  R     eGFR >59 mL/min/1.73 63         BUN/Creatinine Ratio 9 - 20 8 Low   _1 Sodium 134 - 144 mmol/L 140  139  141  143  138 R     Potassium 3.5 - 5.2 mmol/L 4.5  4.4  4.8  4.1  3.9 R     Chloride 96 - 106 mmol/L 102  103  107 High   104  106 R     Calcium 8.7 - 10.2 mg/dL 9.4  9.3  9.0  9.2  8.5 Low  R     Phosphorus 2.8 - 4.1 mg/dL 3.1  3.1  3.0  3.4      Total  Protein 6.0 - 8.5 g/dL 6.6  6.9  6.6  6.8      Albumin 3.8 - 4.9 g/dL 4.5  4.6 R  4.4 R  4.6 R      Globulin, Total 1.5 - 4.5 g/dL 2.1  2.3  2.2  2.2      Albumin/Globulin Ratio 1.2 - 2.2 2.1  2.0  2.0  2.1      Bilirubin Total 0.0 - 1.2 mg/dL 0.7  0.6  0.6  0.5      Alkaline Phosphatase 44 - 121 IU/L 100  98  103 R  113 R      LDH 121 - 224 IU/L 177  200  211  195      AST 0 - 40 IU/L _0 ALT 0 - 44 IU/L _1 GGT 0 - 65 IU/L 61  67 High   54  44      Iron 38 - 169 ug/dL 108  155  113  105      Cholesterol, Total 100 - 199 mg/dL 254 High   262 High   231 High   240 High       Triglycerides 0 - 149 mg/dL 203 High   194 High   164 High   202 High       HDL >39 mg/dL 55  55  61  63      VLDL Cholesterol Cal 5 - 40 mg/dL 37  36  29  36      LDL Chol Calc (NIH) 0 - 99 mg/dL 162 High   171 High   141 High   141 High       Chol/HDL Ratio 0.0 - 5.0 ratio 4.6  4.8 CM  3.8 CM  3.8 CM      Comment:                                   T. Chol/HDL Ratio                                              Men  Women                                1/2 Avg.Risk  3.4    3.3                                    Avg.Risk  5.0    4.4                                 2X Avg.Risk  9.6    7.1                                 3X Avg.Risk 23.4   11.0   Estimated CHD Risk 0.0 - 1.0 times avg. 0.9  1.0 CM  0.7 CM  0.7 CM      Comment: The CHD Risk is based on the T. Chol/HDL ratio. Other  factors affect CHD Risk such as hypertension, smoking,  diabetes, severe obesity, and family history of  premature CHD.   TSH 0.450 - 4.500 uIU/mL 1.630  1.710  1.490  1.430      T4, Total 4.5 - 12.0 ug/dL 6.1  6.4  6.1  6.3      T3 Uptake Ratio 24 - 39 % _2 Free Thyroxine Index 1.2 - 4.9 1.6  1.8  1.7  1.8      Prostate Specific Ag, Serum 0.0 - 4.0 ng/mL 0.8  0.8 CM  2.1 CM  0.8 CM      Comment: Roche ECLIA methodology.  According to the American Urological Association, Serum PSA should   decrease and remain at undetectable levels after radical  prostatectomy. The AUA defines biochemical recurrence as an initial  PSA value 0.2 ng/mL or greater followed by a subsequent confirmatory  PSA value 0.2 ng/mL or greater.  Values obtained with different assay methods or kits cannot be used  interchangeably. Results cannot be interpreted as absolute evidence  of the presence or absence of malignant disease.   WBC 3.4 - 10.8 x10E3/uL 5.0  5.7  5.6  7.3    4.6 R   RBC 4.14 - 5.80 x10E6/uL 5.06  5.42  4.93  5.21    4.92 R   Hemoglobin 13.0 - 17.7 g/dL 16.8  18.0 High   16.8  17.3   14.5 R  16.3 R   Hematocrit 37.5 - 51.0 % 48.2  51.5 High   46.4  50.2   42.2 R, CM  47.8 R   MCV 79 - 97 fL 95  95  94  96    97.2 R   MCH 26.6 - 33.0 pg 33.2 High   33.2 High   34.1 High   33.2 High     33.1 R   MCHC 31.5 - 35.7 g/dL 34.9  35.0  36.2 High   34.5    34.1 R   RDW 11.6 - 15.4 % 12.5  12.0  12.7  12.4    11.5 R   Platelets 150 - 450 x10E3/uL 177  175  203  210    159 R, CM   Neutrophils Not Estab. % 59  52  54  60      Lymphs Not Estab. % 29  34  34  30      Monocytes Not Estab. % _0 Eos Not Estab. % _1 Basos Not Estab. % _2 Neutrophils Absolute 1.4 - 7.0 x10E3/uL 2.9  3.0  3.1  4.4      Lymphocytes Absolute 0.7 - 3.1 x10E3/uL 1.4  2.0  1.9  2.2      Monocytes Absolute 0.1 - 0.9 x10E3/uL 0.4  0.4  0.4  0.5      EOS (ABSOLUTE) 0.0 - 0.4 x10E3/uL 0.2  0.3  0.2  0.2      Basophils Absolute 0.0 - 0.2 x10E3/uL 0.1  0.1  0.1  0.1      Immature Granulocytes Not Estab. % 0  0  1  0      Immature Grans (Abs) 0.0 - 0.1 x10E3/uL 0.0  0.0  0.0  0.0      Resulting Agency  [facility]       Narrative Performed by: Maryan Puls Performed at:  [facility]  [address] [place], [place]  [id number]  Lab Director: Rush Farmer MD, Phone:  [phone]    Specimen Collected: 10/13/21 08:35 Last  Resulted: 10/14/21 08:14      Lab Flowsheet    Order Details    View Encounter    Lab and Collection Details  Routing    Result History    Brewing technologist      CM=Additional comments  R=Reference range differs from displayed range      Result Care Coordination   Patient Communication   Add Comments   Seen Back to Top       Other Results from 10/13/2021   POCT urinalysis dipstick Order: [id number] Status: Final result    Visible to patient: Yes (seen)    Next appt: 01/25/2022 at 08:15 AM in [facility] ([facility])    Dx: Annual physical exam    0 Result Notes        Component Ref Range & Units 7 d ago (10/13/21) 1 yr ago (09/27/20) 2 yr ago (10/01/19) 2 yr ago (08/05/19)  Color, UA  yellow  yellow  yellow  Yellow   Clarity, UA  clear  cloudy  clear  Clear   Glucose, UA Negative Negative  Negative  Negative  Negative   Bilirubin, UA  negative  negative  negative  neg   Ketones, UA  negative  negative  negative  pos CM   Spec Grav, UA 1.010 - 1.025 1.015  1.025  1.025  1.025   Blood, UA  Trace  negative  negative  neg   pH, UA 5.0 - 8.0 6.0  6.0  5.5  6.0   Protein, UA Negative Negative  Negative  Negative  Positive Abnormal  CM   Urobilinogen, UA 0.2 or 1.0 E.U./dL 0.2  0.2  0.2  0.2   Nitrite, UA  negative  negative  negative  neg   Leukocytes, UA Negative Negative  Negative  Negative  Negative   Appearance   medium                 ____________________________________________  EKG  Sinus  Rhythm at 66 bpm WITHIN NORMAL LIMITS ____________________________________________    ____________________________________________   INITIAL IMPRESSION / ASSESSMENT AND PLAN As part of my medical decision making, I reviewed the following data within the electronic MEDICAL RECORD NUMBER       Discussed lab results and EKG findings with patient.  Patient will increase Norvasc from 2.5 mg to 5 mg.  Patient will also start simvastatin at 20 mg and follow-up in 3  months with fasting labs.     ____________________________________________   FINAL CLINICAL IMPRESSION Well exam ED Discharge Orders     None        Note:  This document was prepared using Dragon voice recognition software and may include unintentional dictation errors.

## 2021-10-20 NOTE — Progress Notes (Signed)
Pt presetns today for physical and denies any issues or concerns at this time./CL,RMA ?

## 2021-10-21 ENCOUNTER — Other Ambulatory Visit: Payer: Self-pay | Admitting: Physician Assistant

## 2021-10-21 MED ORDER — SIMVASTATIN 20 MG PO TABS: 20.0000 mg | ORAL_TABLET | Freq: Every day | ORAL | 3 refills | Status: DC

## 2021-10-21 NOTE — Addendum Note (Signed)
Addended by: Gardner Candle on: 10/21/2021 04:38 PM ? ? Modules accepted: Orders ? ?

## 2021-12-21 IMAGING — DX DG CHEST 2V
2 series · 2 of 2 positions shown · non-contrast
Comparison: None.

CLINICAL DATA: Cough.

EXAM:
CHEST - 2 VIEW

[chest pa]
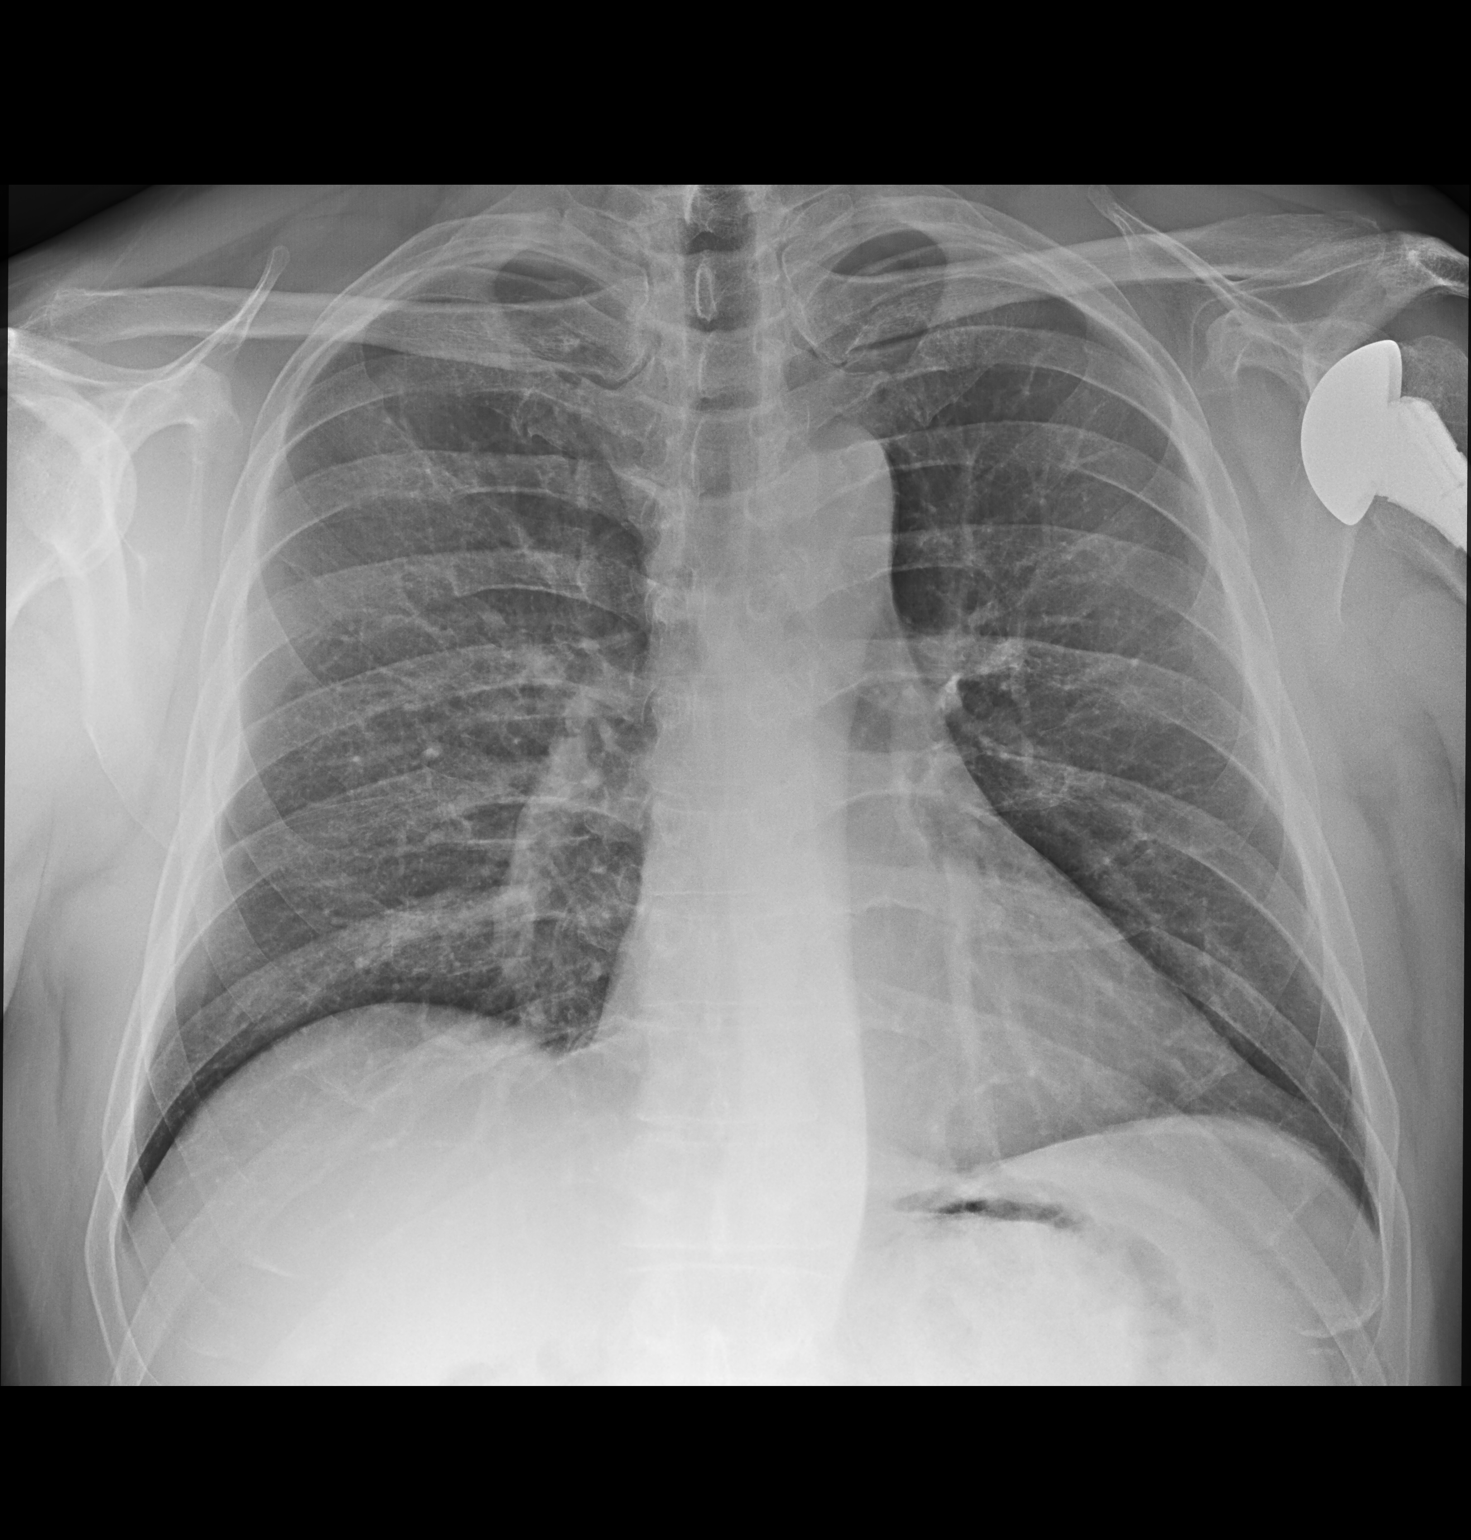

[chest lat]
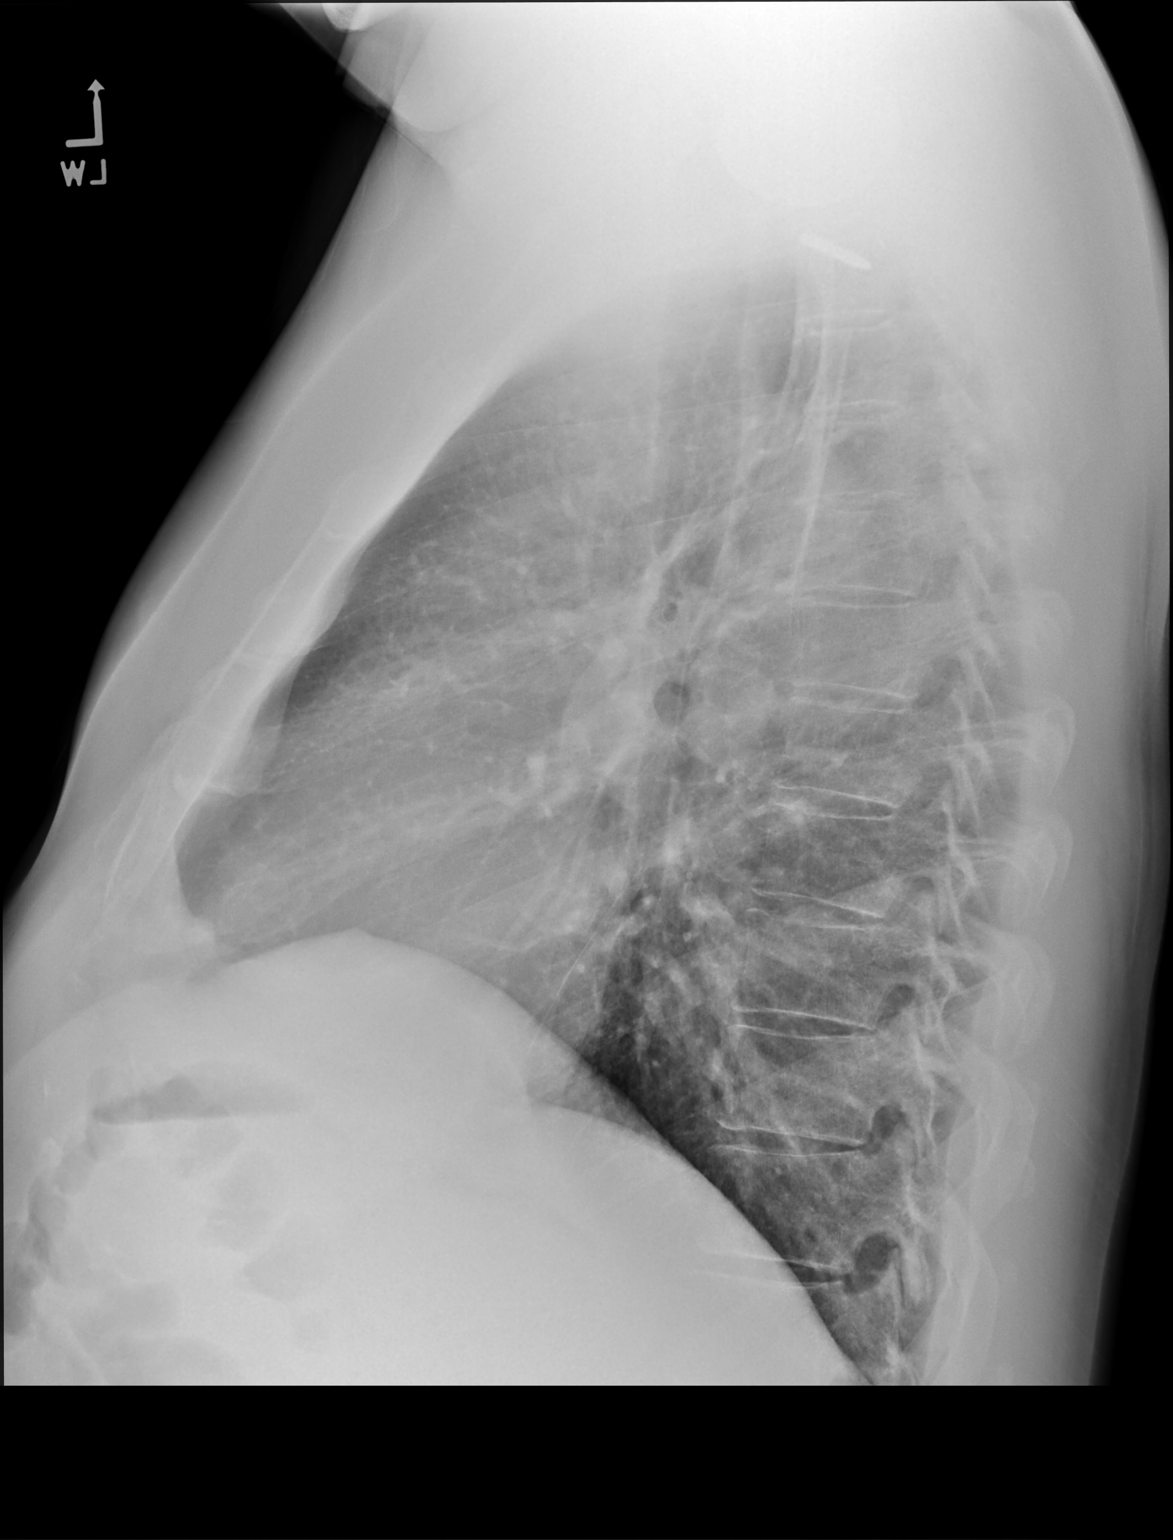

[2 of 2 positions shown; findings below may reference images not displayed]

FINDINGS: The heart size and mediastinal contours are within normal limits.
Both lungs are clear. The visualized skeletal structures are
unremarkable.
IMPRESSION: No active cardiopulmonary disease.

## 2022-01-04 ENCOUNTER — Other Ambulatory Visit: Payer: Self-pay | Admitting: Internal Medicine

## 2022-01-11 ENCOUNTER — Telehealth: Payer: Self-pay

## 2022-01-11 DIAGNOSIS — Z1211 Encounter for screening for malignant neoplasm of colon: Secondary | ICD-10-CM

## 2022-01-11 NOTE — Telephone Encounter (Signed)
Justin Jefferson called [facility] requesting a referral to Dr. Donnalee Curry for Screening Colonoscopy.  Referral faxed to Dr. Donnalee Curry at [facility].  AMD

## 2022-01-19 DIAGNOSIS — K4090 Unilateral inguinal hernia, without obstruction or gangrene, not specified as recurrent: Secondary | ICD-10-CM | POA: Diagnosis not present

## 2022-01-19 DIAGNOSIS — Z1211 Encounter for screening for malignant neoplasm of colon: Secondary | ICD-10-CM | POA: Diagnosis not present

## 2022-01-25 ENCOUNTER — Other Ambulatory Visit: Payer: Self-pay

## 2022-01-25 ENCOUNTER — Other Ambulatory Visit: Payer: Self-pay | Admitting: General Surgery

## 2022-01-25 DIAGNOSIS — E782 Mixed hyperlipidemia: Secondary | ICD-10-CM

## 2022-01-25 NOTE — Progress Notes (Signed)
Lipid panel 

## 2022-01-25 NOTE — Progress Notes (Signed)
ubjective:     Patient ID: Justin Jefferson is a 51 y.o. male.   HPI   The following portions of the patient's history were reviewed and updated as appropriate.   This a new patient is here today for: office visit. Here to discuss having a colonoscopy referred by Mardee Postin PA. He states his bowels move daily, no rectal bleeding or pain.   The patient's wife works at [employer] and she had had her procedure there.  Unfortunately his insurance does not cover that facility.   The patient is unaware of any family history of colon cancer or polyps in first-degree relatives.   He has been aware of a right groin bulge for the last 2-1/2 years without significant interval change.  No pain or tenderness.  No obstructive symptoms.   Review of Systems Constitutional: Negative for chills and fever. Respiratory: Negative for cough.   Gastrointestinal: Negative for anal bleeding and rectal pain.         Chief Complaint  Patient presents with   Pre-op Exam      BP 122/88   Pulse 73   Temp 36.9 C (98.4 F)   Ht 177.8 cm ('5\' 10"' )   Wt 98.4 kg (217 lb)   SpO2 97%   BMI 31.14 kg/m        Past Medical History:  Diagnosis Date   Anxiety     Arthritis     Carpal tunnel syndrome      Bilateral   Ruptured lumbar disc     Shoulder dislocation     Sinusitis, unspecified             Past Surgical History:  Procedure Laterality Date   left shoulder reconstruction Left 1987   BACK SURGERY   1995    Lumbar disc rupture, L5-S1   FUNCTIONAL ENDOSCOPIC SINUS SURGERY   2000   ENDOSCOPIC CARPAL TUNNEL RELEASE Right 2016   ARTHROSCOPY SHOULDER Left 04/12/2018          Social History          Socioeconomic History   Marital status: Married  Occupational History   Occupation: Fireman  Tobacco Use   Smoking status: Never   Smokeless tobacco: Never  Substance and Sexual Activity   Alcohol use: Yes   Drug use: No   Sexual activity: Defer             Allergies  Allergen  Reactions   Zolpidem Tartrate Syncope      blackouts      Current Medications        Current Outpatient Medications  Medication Sig Dispense Refill   amLODIPine (NORVASC) 5 MG tablet Take 5 mg by mouth once daily       gdsl-burd-redcl-yldk-be-ben-mt 500-100 mg Cap Take by mouth once daily       simvastatin (ZOCOR) 20 MG tablet Take 20 mg by mouth at bedtime       traZODone (DESYREL) 50 MG tablet Take 50 mg by mouth at bedtime       meloxicam (MOBIC) 15 MG tablet Take 15 mg by mouth once daily. (Patient not taking: Reported on 01/19/2022)        No current facility-administered medications for this visit.             Family History  Problem Relation Age of Onset   Hyperlipidemia (Elevated cholesterol) Mother     High blood pressure (Hypertension) Mother     Diabetes Mother  High blood pressure (Hypertension) Father     Prostate cancer Father     Parkinsonism Father     High blood pressure (Hypertension) Maternal Grandfather     Stroke Maternal Grandfather     Heart disease Maternal Grandfather     Myocardial Infarction (Heart attack) Maternal Grandfather     High blood pressure (Hypertension) Paternal Grandfather     Myocardial Infarction (Heart attack) Paternal Grandfather     COPD Paternal Grandfather          Labs and Radiology:    October 13, 2021 laboratory:     Glucose 70 - 99 mg/dL 94   Uric Acid 3.8 - 8.4 mg/dL 7.1   Comment:            Therapeutic target for gout patients: <6.0  BUN 6 - 24 mg/dL 11   Creatinine, Ser 0.76 - 1.27 mg/dL 1.35 High    eGFR >59 mL/min/1.73 63   BUN/Creatinine Ratio 9 - 20 8 Low    Sodium 134 - 144 mmol/L 140   Potassium 3.5 - 5.2 mmol/L 4.5   Chloride 96 - 106 mmol/L 102   Calcium 8.7 - 10.2 mg/dL 9.4   Phosphorus 2.8 - 4.1 mg/dL 3.1   Total Protein 6.0 - 8.5 g/dL 6.6   Albumin 3.8 - 4.9 g/dL 4.5   Globulin, Total 1.5 - 4.5 g/dL 2.1   Albumin/Globulin Ratio 1.2 - 2.2 2.1   Bilirubin Total 0.0 - 1.2 mg/dL 0.7   Alkaline  Phosphatase 44 - 121 IU/L 100   LDH 121 - 224 IU/L 177   AST 0 - 40 IU/L 18   ALT 0 - 44 IU/L 22   GGT 0 - 65 IU/L 61   Iron 38 - 169 ug/dL 108   Cholesterol, Total 100 - 199 mg/dL 254 High    Triglycerides 0 - 149 mg/dL 203 High    HDL >39 mg/dL 55   VLDL Cholesterol Cal 5 - 40 mg/dL 37   LDL Chol Calc (NIH) 0 - 99 mg/dL 162 High    Chol/HDL Ratio 0.0 - 5.0 ratio 4.6     WBC 3.4 - 10.8 x10E3/uL 5.0   RBC 4.14 - 5.80 x10E6/uL 5.06   Hemoglobin 13.0 - 17.7 g/dL 16.8   Hematocrit 37.5 - 51.0 % 48.2   MCV 79 - 97 fL 95   MCH 26.6 - 33.0 pg 33.2 High    MCHC 31.5 - 35.7 g/dL 34.9   RDW 11.6 - 15.4 % 12.5   Platelets 150 - 450 x10E3/uL 177   Neutrophils Not Estab. % 59   Lymphs Not Estab. % 29   Monocytes Not Estab. % 8   Eos Not Estab. % 3   Basos Not Estab. % 1   Neutrophils Absolute 1.4 - 7.0 x10E3/uL 2.9   Lymphocytes Absolute 0.7 - 3.1 x10E3/uL 1.4   Monocytes Absolute 0.1 - 0.9 x10E3/uL 0.4   EOS (ABSOLUTE) 0.0 - 0.4 x10E3/uL 0.2   Basophils Absolute 0.0 - 0.2 x10E3/uL 0.1   Immature Granulocytes Not Estab. % 0   Immature Grans (Abs) 0.0 - 0.1 x10E3/uL 0.0       ECG of the same date showed sinus rhythm.            Objective:   Physical Exam Constitutional:      Appearance: Normal appearance.  Cardiovascular:     Rate and Rhythm: Normal rate and regular rhythm.     Pulses: Normal pulses.  Heart sounds: Normal heart sounds.  Pulmonary:     Effort: Pulmonary effort is normal.     Breath sounds: Normal breath sounds.  Abdominal:     Hernia: A hernia is present. Hernia is present in the right inguinal area. Right femoral hernia: Reducible in standing position.  Musculoskeletal:     Cervical back: Neck supple.  Skin:    General: Skin is warm and dry.  Neurological:     Mental Status: He is alert and oriented to person, place, and time.  Psychiatric:        Mood and Affect: Mood normal.        Behavior: Behavior normal.           Assessment:      Candidate for colon cancer screening.   Minimally symptomatic right inguinal hernia.    Plan:     Options for colon cancer screening were reviewed including 1) Cologuard and 2) colonoscopy.  Pros and cons of each were discussed.  Patient is interested in proceeding with colonoscopy.   He has a right inguinal hernia which is of moderate size, and fortunately asymptomatic.  He reports he plans to retire in the middle of the year and would plan on having it repaired after that.   Discussed mesh repair.  Opportunity to meet with robotic/laparoscopic positions reviewed, declined as of this time.   We will have the staff contact him next week regarding scheduling for his colonoscopy.  He was instructed in regards to prep today by the staff.      This note is partially prepared by Karie Fetch, RN, acting as a scribe in the presence of Dr. Hervey Ard, MD.  The documentation recorded by the scribe accurately reflects the service I personally performed and the decisions made by me.    Robert Bellow, MD FACS

## 2022-01-26 LAB — LIPID PANEL
Chol/HDL Ratio: 3.6 ratio (ref 0.0–5.0)
Cholesterol, Total: 210 mg/dL — ABNORMAL HIGH (ref 100–199)
HDL: 58 mg/dL (ref >39)
LDL Chol Calc (NIH): 121 mg/dL — ABNORMAL HIGH (ref 0–99)
Triglycerides: 175 mg/dL — ABNORMAL HIGH (ref 0–149)
VLDL Cholesterol Cal: 31 mg/dL (ref 5–40)

## 2022-01-31 ENCOUNTER — Encounter: Payer: Self-pay | Admitting: General Surgery

## 2022-02-01 ENCOUNTER — Ambulatory Visit: Payer: 59 | Admitting: Anesthesiology

## 2022-02-01 ENCOUNTER — Ambulatory Visit
Admission: RE | Admit: 2022-02-01 | Discharge: 2022-02-01 | Disposition: A | Discharge status: Home / Self Care | Payer: 59 | Attending: General Surgery | Admitting: General Surgery

## 2022-02-01 ENCOUNTER — Encounter
Admission: RE | Disposition: A | Discharge status: Home / Self Care | Payer: Self-pay | Source: Home / Self Care | Attending: General Surgery

## 2022-02-01 DIAGNOSIS — I10 Essential (primary) hypertension: Secondary | ICD-10-CM | POA: Diagnosis not present

## 2022-02-01 DIAGNOSIS — Z1211 Encounter for screening for malignant neoplasm of colon: Secondary | ICD-10-CM | POA: Diagnosis not present

## 2022-02-01 DIAGNOSIS — K5730 Diverticulosis of large intestine without perforation or abscess without bleeding: Secondary | ICD-10-CM | POA: Insufficient documentation

## 2022-02-01 HISTORY — DX: Carpal tunnel syndrome, unspecified upper limb: G56.00

## 2022-02-01 HISTORY — DX: Unspecified dislocation of unspecified shoulder joint, initial encounter: S43.006A

## 2022-02-01 HISTORY — DX: Other intervertebral disc displacement, lumbar region: M51.26

## 2022-02-01 HISTORY — PX: COLONOSCOPY WITH PROPOFOL: SHX5780

## 2022-02-01 HISTORY — DX: Chronic sinusitis, unspecified: J32.9

## 2022-02-01 SURGERY — COLONOSCOPY WITH PROPOFOL
Anesthesia: General

## 2022-02-01 MED ORDER — PHENYLEPHRINE 80 MCG/ML (10ML) SYRINGE FOR IV PUSH (FOR BLOOD PRESSURE SUPPORT)
PREFILLED_SYRINGE | INTRAVENOUS | Status: DC | PRN
  Administered 2022-02-01: 80 ug via INTRAVENOUS

## 2022-02-01 MED ORDER — EPHEDRINE SULFATE (PRESSORS) 50 MG/ML IJ SOLN
INTRAMUSCULAR | Status: DC | PRN
  Administered 2022-02-01: 10 mg via INTRAVENOUS

## 2022-02-01 MED ORDER — LIDOCAINE HCL (CARDIAC) PF 100 MG/5ML IV SOSY
PREFILLED_SYRINGE | INTRAVENOUS | Status: DC | PRN
  Administered 2022-02-01: 100 mg via INTRAVENOUS

## 2022-02-01 MED ORDER — PROPOFOL 10 MG/ML IV BOLUS
INTRAVENOUS | Status: DC | PRN
  Administered 2022-02-01: 10 mg via INTRAVENOUS
  Administered 2022-02-01: 70 mg via INTRAVENOUS
  Administered 2022-02-01: 20 mg via INTRAVENOUS

## 2022-02-01 MED ORDER — GLYCOPYRROLATE 0.2 MG/ML IJ SOLN
INTRAMUSCULAR | Status: DC | PRN
  Administered 2022-02-01: .2 mg via INTRAVENOUS

## 2022-02-01 MED ORDER — PROPOFOL 500 MG/50ML IV EMUL
INTRAVENOUS | Status: DC | PRN
  Administered 2022-02-01: 165 ug/kg/min via INTRAVENOUS

## 2022-02-01 MED ORDER — DEXMEDETOMIDINE HCL IN NACL 200 MCG/50ML IV SOLN
INTRAVENOUS | Status: DC | PRN
  Administered 2022-02-01: 12 ug via INTRAVENOUS

## 2022-02-01 MED ORDER — SODIUM CHLORIDE 0.9 % IV SOLN
INTRAVENOUS | Status: DC
  Administered 2022-02-01: 20 mL/h via INTRAVENOUS

## 2022-02-01 NOTE — Anesthesia Procedure Notes (Signed)
Procedure Name: General with mask airway Date/Time: 02/01/2022 11:00 AM  Performed by: Mohammed Kindle, CRNAPre-anesthesia Checklist: Patient identified, Emergency Drugs available, Suction available and Patient being monitored Patient Re-evaluated:Patient Re-evaluated prior to induction Oxygen Delivery Method: Simple face mask Induction Type: IV induction Placement Confirmation: positive ETCO2, CO2 detector and breath sounds checked- equal and bilateral Dental Injury: Teeth and Oropharynx as per pre-operative assessment

## 2022-02-01 NOTE — H&P (Signed)
Justin Jefferson [id number] 1971-03-08     HPI:  Healthy 51 y/o male for a screening colonoscopy.   Medications Prior to Admission  Medication Sig Dispense Refill Last Dose   ALPRAZolam (XANAX) 0.25 MG tablet Take 1 tablet (0.25 mg total) by mouth 2 (two) times daily as needed for anxiety. 45 tablet 1 Past Week   amLODipine (NORVASC) 5 MG tablet TAKE 1 TABLET (5 MG TOTAL) BY MOUTH DAILY. 30 tablet 1 01/31/2022   simvastatin (ZOCOR) 20 MG tablet Take 1 tablet (20 mg total) by mouth at bedtime. 90 tablet 3 01/31/2022   traZODone (DESYREL) 50 MG tablet TAKE 1 TABLET BY MOUTH EVERYDAY AT BEDTIME 90 tablet 2 01/31/2022   Allergies  Allergen Reactions   Ambien [Zolpidem Tartrate]     blackouts   Past Medical History:  Diagnosis Date   Anxiety    Arthritis    Carpal tunnel syndrome    Ruptured lumbar disc    Shoulder dislocation    Sinusitis    Past Surgical History:  Procedure Laterality Date   BACK SURGERY     CARPAL TUNNEL RELEASE Right 2016   [facility]   LUMBAR LAMINECTOMY/DECOMPRESSION MICRODISCECTOMY  1995   [facility]   NASAL SINUS SURGERY  2000   OPEN ANTERIOR SHOULDER RECONSTRUCTION Left 1987   secondary to football dislocation   REVERSE SHOULDER ARTHROPLASTY Left 04/12/2018   Procedure: LEFT SHOULDER ANATOMOC VS. REVERSE SHOULDER ARTHROPLASTY;  Surgeon: Beverely Low, MD;  Location: [facility] OR;  Service: Orthopedics;  Laterality: Left;   shoulder replacement Left 04/12/2018   Anatomical   Social History   Socioeconomic History   Marital status: Married    Spouse name: Not on file   Number of children: Not on file   Years of education: Not on file   Highest education level: Not on file  Occupational History   Occupation: firefighter    Employer: [employer]  Tobacco Use   Smoking status: Never   Smokeless tobacco: Never  Vaping Use   Vaping Use: Never used  Substance and Sexual Activity   Alcohol use: Yes    Alcohol/week: 35.0 standard  drinks of alcohol    Types: 14 Cans of beer, 21 Shots of liquor per week    Comment: averages 2 beers,   2hots of bourbon nightly    Drug use: Never   Sexual activity: Yes    Partners: Female    Birth control/protection: None    Comment: wife Marcelino Duster   Other Topics Concern   Not on file  Social History Narrative   Not on file   Social Determinants of Health   Financial Resource Strain: Not on file  Food Insecurity: Not on file  Transportation Needs: Not on file  Physical Activity: Insufficiently Active (03/16/2018)   Exercise Vital Sign    Days of Exercise per Week: 3 days    Minutes of Exercise per Session: 30 min  Stress: Stress Concern Present (03/16/2018)   [employer] of Occupational Health - Occupational Stress Questionnaire    Feeling of Stress : Rather much  Social Connections: Unknown (03/16/2018)   Social Connection and Isolation Panel [NHANES]    Frequency of Communication with Friends and Family: Not on file    Frequency of Social Gatherings with Friends and Family: Not on file    Attends Religious Services: Not on file    Active Member of Clubs or Organizations: Not on file    Attends Banker Meetings: Not  on file    Marital Status: Married  Catering manager Violence: Not on file   Social History   Social History Narrative   Not on file     ROS: Negative.     PE: HEENT: Negative. Lungs: Clear. Cardio: RR.   Assessment/Plan:  Proceed with planned colonoscopy.   Merrily Pew [facility] 02/01/2022

## 2022-02-01 NOTE — Anesthesia Preprocedure Evaluation (Addendum)
Anesthesia Evaluation  Patient identified by MRN, date of birth, ID band Patient awake    Reviewed: Allergy & Precautions, NPO status , Patient's Chart, lab work & pertinent test results  Airway Mallampati: III  TM Distance: >3 FB Neck ROM: full    Dental  (+) Chipped   Pulmonary neg pulmonary ROS,    Pulmonary exam normal        Cardiovascular hypertension, Pt. on medications Normal cardiovascular exam     Neuro/Psych negative neurological ROS  negative psych ROS   GI/Hepatic negative GI ROS, Neg liver ROS,   Endo/Other  negative endocrine ROS  Renal/GU negative Renal ROS  negative genitourinary   Musculoskeletal  (+) Arthritis , LUMBAR LAMINECTOMY/DECOMPRESSION MICRODISCECTOMY   Abdominal   Peds  Hematology negative hematology ROS (+)   Anesthesia Other Findings Past Medical History: No date: Anxiety No date: Arthritis No date: Carpal tunnel syndrome No date: Ruptured lumbar disc No date: Shoulder dislocation No date: Sinusitis  Past Surgical History: No date: BACK SURGERY 2016: CARPAL TUNNEL RELEASE; Right     Comment:  [facility] 1995: LUMBAR LAMINECTOMY/DECOMPRESSION MICRODISCECTOMY     Comment:  [facility] 2000: NASAL SINUS SURGERY 1987: OPEN ANTERIOR SHOULDER RECONSTRUCTION; Left     Comment:  secondary to football dislocation 04/12/2018: REVERSE SHOULDER ARTHROPLASTY; Left     Comment:  Procedure: LEFT SHOULDER ANATOMOC VS. REVERSE SHOULDER               ARTHROPLASTY;  Surgeon: Beverely Low, MD;  Location: [facility]               OR;  Service: Orthopedics;  Laterality: Left; 04/12/2018: shoulder replacement; Left     Comment:  Anatomical     Reproductive/Obstetrics negative OB ROS                             Anesthesia Physical Anesthesia Plan  ASA: 2  Anesthesia Plan: General   Post-op Pain Management:    Induction:   PONV Risk Score and  Plan: Propofol infusion and TIVA  Airway Management Planned: Natural Airway  Additional Equipment:   Intra-op Plan:   Post-operative Plan:   Informed Consent: I have reviewed the patients History and Physical, chart, labs and discussed the procedure including the risks, benefits and alternatives for the proposed anesthesia with the patient or authorized representative who has indicated his/her understanding and acceptance.     Dental Advisory Given  Plan Discussed with: Anesthesiologist, CRNA and Surgeon  Anesthesia Plan Comments:        Anesthesia Quick Evaluation

## 2022-02-01 NOTE — Transfer of Care (Signed)
Immediate Anesthesia Transfer of Care Note  Patient: Justin Jefferson  Procedure(s) Performed: COLONOSCOPY WITH PROPOFOL  Patient Location: Endoscopy Unit  Anesthesia Type:General  Level of Consciousness: drowsy and patient cooperative  Airway & Oxygen Therapy: Patient Spontanous Breathing and Patient connected to face mask oxygen  Post-op Assessment: Report given to RN and Post -op Vital signs reviewed and stable  Post vital signs: Reviewed and stable  Last Vitals:  Vitals Value Taken Time  BP 89/54 02/01/22 1112  Temp    Pulse 59 02/01/22 1113  Resp 16 02/01/22 1113  SpO2 100 % 02/01/22 1113  Vitals shown include unvalidated device data.  Last Pain:  Vitals:   02/01/22 0939  TempSrc: Temporal  PainSc: 0-No pain         Complications: No notable events documented.

## 2022-02-01 NOTE — Op Note (Signed)
[facility] Gastroenterology Patient Name: Justin Jefferson Procedure Date: 02/01/2022 10:34 AM MRN: [id number] Account #: [id number] Date of Birth: 30-Nov-1970 Admit Type: Outpatient Age: 51 Room: [facility] ENDO ROOM 1 Gender: Male Note Status: Finalized Instrument Name: Peds Colonoscope [id number] Procedure:             Colonoscopy Indications:           Screening for colorectal malignant neoplasm Providers:             Earline Mayotte, MD Referring MD:          Duncan Dull, MD (Referring MD) Medicines:             Propofol per Anesthesia Complications:         No immediate complications. Procedure:             Pre-Anesthesia Assessment:                        - Prior to the procedure, a History and Physical was                         performed, and patient medications, allergies and                         sensitivities were reviewed. The patient's tolerance                         of previous anesthesia was reviewed.                        - The risks and benefits of the procedure and the                         sedation options and risks were discussed with the                         patient. All questions were answered and informed                         consent was obtained.                        After obtaining informed consent, the colonoscope was                         passed under direct vision. Throughout the procedure,                         the patient's blood pressure, pulse, and oxygen                         saturations were monitored continuously. The                         Colonoscope was introduced through the anus and                         advanced to the the terminal ileum. The colonoscopy  was performed without difficulty. The patient                         tolerated the procedure well. The quality of the bowel                         preparation was adequate to identify polyps. Findings:      A few small and  large-mouthed diverticula were found in the sigmoid       colon.      The retroflexed view of the distal rectum and anal verge was normal and       showed no anal or rectal abnormalities. Impression:            - Diverticulosis in the sigmoid colon.                        - The distal rectum and anal verge are normal on                         retroflexion view.                        - No specimens collected. Recommendation:        - Repeat colonoscopy in 10 years for screening                         purposes. Procedure Code(s):     --- Professional ---                        [phone], Colonoscopy, flexible; diagnostic, including                         collection of specimen(s) by brushing or washing, when                         performed (separate procedure) Diagnosis Code(s):     --- Professional ---                        K57.30, Diverticulosis of large intestine without                         perforation or abscess without bleeding                        Z12.11, Encounter for screening for malignant neoplasm                         of colon CPT copyright 2019 American Medical Association. All rights reserved. The codes documented in this report are preliminary and upon coder review may  be revised to meet current compliance requirements. Earline Mayotte, MD 02/01/2022 11:10:44 AM This report has been signed electronically. Number of Addenda: 0 Note Initiated On: 02/01/2022 10:34 AM Scope Withdrawal Time: 0 hours 12 minutes 47 seconds  Total Procedure Duration: 0 hours 19 minutes 20 seconds  Estimated Blood Loss:  Estimated blood loss: none.      [facility]

## 2022-02-02 ENCOUNTER — Encounter: Payer: Self-pay | Admitting: General Surgery

## 2022-02-02 NOTE — Anesthesia Postprocedure Evaluation (Signed)
Anesthesia Post Note  Patient: Justin Jefferson  Procedure(s) Performed: COLONOSCOPY WITH PROPOFOL  Patient location during evaluation: Endoscopy Anesthesia Type: General Level of consciousness: awake and alert Pain management: pain level controlled Vital Signs Assessment: post-procedure vital signs reviewed and stable Respiratory status: spontaneous breathing, nonlabored ventilation and respiratory function stable Cardiovascular status: blood pressure returned to baseline and stable Postop Assessment: no apparent nausea or vomiting Anesthetic complications: no   No notable events documented.   Last Vitals:  Vitals:   02/01/22 1129 02/01/22 1136  BP: 109/77 107/66  Pulse:    Resp:    Temp:    SpO2:      Last Pain:  Vitals:   02/02/22 0745  TempSrc:   PainSc: 0-No pain                 Foye Deer

## 2022-04-16 ENCOUNTER — Other Ambulatory Visit: Payer: Self-pay | Admitting: Internal Medicine

## 2022-04-18 ENCOUNTER — Other Ambulatory Visit: Payer: Self-pay | Admitting: Internal Medicine

## 2022-04-18 NOTE — Telephone Encounter (Signed)
LMTCB. Pt has not been seen in over year. Please schedule pt an appt when he returns call to the office.

## 2022-05-02 ENCOUNTER — Other Ambulatory Visit: Payer: Self-pay | Admitting: General Surgery

## 2022-05-02 DIAGNOSIS — K4090 Unilateral inguinal hernia, without obstruction or gangrene, not specified as recurrent: Secondary | ICD-10-CM | POA: Diagnosis not present

## 2022-05-02 NOTE — Progress Notes (Signed)
Progress Notes - documented in this encounter Iveth Heidemann, Geronimo Boot, MD - 05/02/2022 10:00 AM EDT Formatting of this note is different from the original. Subjective:   Patient ID: Justin Jefferson is a 51 y.o. male.  HPI  The following portions of the patient's history were reviewed and updated as appropriate.  This an established patient is here today for: office visit. Patient wishes to discuss surgery for right inguinal hernia.   He states his bowels move daily, no rectal bleeding or pain.  He has been aware of a right groin bulge for the last 2-1/2 years without significant interval change. Patient reports a painful burning sensation when he stands for long periods of time. He reports bulge is the same size.   Patient states he just retired from the [employer]. He is doing more physical labor with responsibilities around the house since his retirement.   Chief Complaint  Patient presents with  Inguinal Hernia    BP (!) 140/86  Pulse 54  Temp 36.7 C (98 F)  Ht 177.8 cm (_0 )  Wt 96.2 kg (212 lb)  SpO2 98%  BMI 30.42 kg/m   Past Medical History:  Diagnosis Date  Anxiety  Arthritis  Carpal tunnel syndrome  Bilateral  Ruptured lumbar disc  Shoulder dislocation  Sinusitis, unspecified    Past Surgical History:  Procedure Laterality Date  left shoulder reconstruction Left 1987  BACK SURGERY 1995  Lumbar disc rupture, L5-S1  FUNCTIONAL ENDOSCOPIC SINUS SURGERY 2000  ENDOSCOPIC CARPAL TUNNEL RELEASE Right 2016  ARTHROSCOPY SHOULDER Left 04/12/2018  COLONOSCOPY 02/01/2022  Dr Bary Castilla. Normal. Follow up 2033.    Social History   Socioeconomic History  Marital status: Married  Occupational History  Occupation: Fireman  Tobacco Use  Smoking status: Never  Smokeless tobacco: Never  Substance and Sexual Activity  Alcohol use: Yes  Comment: 1 drink per evening  Drug use: No  Sexual activity: Defer    Allergies  Allergen Reactions   Zolpidem Tartrate Syncope  blackouts   Current Outpatient Medications  Medication Sig Dispense Refill  traZODone (DESYREL) 50 MG tablet Take 50 mg by mouth at bedtime  amLODIPine (NORVASC) 5 MG tablet Take 5 mg by mouth once daily (Patient not taking: Reported on 05/02/2022)  gdsl-burd-redcl-yldk-be-ben-mt 500-100 mg Cap Take by mouth once daily (Patient not taking: Reported on 05/02/2022)  meloxicam (MOBIC) 15 MG tablet Take 15 mg by mouth once daily. (Patient not taking: Reported on 01/19/2022)  polyethylene glycol (MIRALAX) powder One bottle for colonoscopy prep. Use as directed. (Patient not taking: Reported on 05/02/2022) 255 g 0  simvastatin (ZOCOR) 20 MG tablet Take 20 mg by mouth at bedtime (Patient not taking: Reported on 05/02/2022)   No current facility-administered medications for this visit.   Family History  Problem Relation Age of Onset  Hyperlipidemia (Elevated cholesterol) Mother  High blood pressure (Hypertension) Mother  Diabetes Mother  High blood pressure (Hypertension) Father  Prostate cancer Father  Parkinsonism Father  High blood pressure (Hypertension) Maternal Grandfather  Stroke Maternal Grandfather  Heart disease Maternal Grandfather  Myocardial Infarction (Heart attack) Maternal Grandfather  High blood pressure (Hypertension) Paternal Grandfather  Myocardial Infarction (Heart attack) Paternal Grandfather  COPD Paternal Grandfather   Labs and Radiology:   September 23, 2021 laboratory:  Glucose 70 - 99 mg/dL 94  Uric Acid 3.8 - 8.4 mg/dL 7.1  Comment: Therapeutic target for gout patients: <6.0 BUN 6 - 24 mg/dL 11  Creatinine, Ser 0.76 - 1.27 mg/dL 1.35 High  eGFR >59 mL/min/1.73 63  BUN/Creatinine Ratio 9 - 20 8 Low  Sodium 134 - 144 mmol/L 140  Potassium 3.5 - 5.2 mmol/L 4.5  Chloride 96 - 106 mmol/L 102  Calcium 8.7 - 10.2 mg/dL 9.4  Phosphorus 2.8 - 4.1 mg/dL 3.1  Total Protein 6.0 - 8.5 g/dL 6.6  Albumin 3.8 - 4.9 g/dL 4.5  Globulin, Total 1.5  - 4.5 g/dL 2.1  Albumin/Globulin Ratio 1.2 - 2.2 2.1  Bilirubin Total 0.0 - 1.2 mg/dL 0.7  Alkaline Phosphatase 44 - 121 IU/L 100  LDH 121 - 224 IU/L 177  AST 0 - 40 IU/L 18  ALT 0 - 44 IU/L 22  GGT 0 - 65 IU/L 61  Iron 38 - 169 ug/dL 108  Cholesterol, Total 100 - 199 mg/dL 254 High  Triglycerides 0 - 149 mg/dL 203 High  HDL >39 mg/dL 55  VLDL Cholesterol Cal 5 - 40 mg/dL 37  LDL Chol Calc (NIH) 0 - 99 mg/dL 162 High  Chol/HDL Ratio 0.0 - 5.0 ratio 4.6  Comment: T. Chol/HDL Ratio  Men Women  1/2 Avg.Risk 3.4 3.3  Avg.Risk 5.0 4.4  2X Avg.Risk 9.6 7.1  3X Avg.Risk 23.4 11.0  Estimated CHD Risk 0.0 - 1.0 times avg. 0.9  Comment: The CHD Risk is based on the T. Chol/HDL ratio. Other  factors affect CHD Risk such as hypertension, smoking,  diabetes, severe obesity, and family history of  premature CHD.  TSH 0.450 - 4.500 uIU/mL 1.630  T4, Total 4.5 - 12.0 ug/dL 6.1  T3 Uptake Ratio 24 - 39 % 26  Free Thyroxine Index 1.2 - 4.9 1.6  Prostate Specific Ag, Serum 0.0 - 4.0 ng/mL 0.8  Comment: Roche ECLIA methodology.  According to the American Urological Association, Serum PSA should  decrease and remain at undetectable levels after radical  prostatectomy. The AUA defines biochemical recurrence as an initial  PSA value 0.2 ng/mL or greater followed by a subsequent confirmatory  PSA value 0.2 ng/mL or greater.  Values obtained with different assay methods or kits cannot be used  interchangeably. Results cannot be interpreted as absolute evidence  of the presence or absence of malignant disease.  WBC 3.4 - 10.8 x10E3/uL 5.0  RBC 4.14 - 5.80 x10E6/uL 5.06  Hemoglobin 13.0 - 17.7 g/dL 16.8  Hematocrit 37.5 - 51.0 % 48.2  MCV 79 - 97 fL 95  MCH 26.6 - 33.0 pg 33.2 High  MCHC 31.5 - 35.7 g/dL 34.9  RDW 11.6 - 15.4 % 12.5  Platelets 150 - 450 x10E3/uL 177  Neutrophils Not Estab. % 59  Lymphs Not Estab. % 29  Monocytes Not Estab. % 8  Eos Not Estab. % 3  Basos Not Estab. % 1   Neutrophils Absolute 1.4 - 7.0 x10E3/uL 2.9  Lymphocytes Absolute 0.7 - 3.1 x10E3/uL 1.4  Monocytes Absolute 0.1 - 0.9 x10E3/uL 0.4  EOS (ABSOLUTE) 0.0 - 0.4 x10E3/uL 0.2  Basophils Absolute 0.0 - 0.2 x10E3/uL 0.1  Immature Granulocytes Not Estab. % 0  Immature Grans (Abs) 0.0 - 0.1 x10E3/uL 0.0  Colonoscopy February 01, 2022:  Normal.  Review of Systems  Constitutional: Negative for chills and fever.  Respiratory: Negative for cough.    Objective:  Physical Exam Constitutional:  Appearance: Normal appearance.  Cardiovascular:  Rate and Rhythm: Normal rate and regular rhythm.  Pulses: Normal pulses.  Heart sounds: Normal heart sounds.  Pulmonary:  Effort: Pulmonary effort is normal.  Breath sounds: Normal breath sounds.  Abdominal:  Hernia: A  hernia is present. Hernia is present in the umbilical area (5 mm defect at umbilicus, asymptomatic by report.) and right inguinal area (reducible). There is no hernia in the left inguinal area.  Comments: Testes down and symmetrical bilaterally.  Musculoskeletal:  Cervical back: Neck supple.  Neurological:  Mental Status: He is alert and oriented to person, place, and time.  Psychiatric:  Mood and Affect: Mood normal.  Behavior: Behavior normal.    Assessment:   Asymptomatic umbilical hernia, unchanged over 9 years. Plan: Observation.  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 Plan: Elective repair  Plan:   Indications for surgical repair and use of prosthetic mesh reviewed. Discussed again robotic/laparoscopic positions, declined.  Role of postoperative ice, supportive underwear and avoiding lifting over 10 pounds for the first week after surgery reviewed.  This note is partially prepared by Ledell Noss, CMA acting as a scribe in the presence of Dr. Hervey Ard, MD.   The documentation recorded by the scribe accurately reflects the service I personally performed and the  decisions made by me.   Robert Bellow, MD FACS

## 2022-05-05 ENCOUNTER — Encounter
Admission: RE | Admit: 2022-05-05 | Discharge: 2022-05-05 | Disposition: A | Discharge status: Home / Self Care | Payer: 59 | Source: Ambulatory Visit | Attending: General Surgery | Admitting: General Surgery

## 2022-05-05 ENCOUNTER — Other Ambulatory Visit: Payer: Self-pay

## 2022-05-05 NOTE — Patient Instructions (Addendum)
Your procedure is scheduled on: May 12, 2022 FRIDAY Report to the Registration Desk on the 1st floor of the [facility]. To find out your arrival time, please call [phone] between 1PM - 3PM on: May 11, 2022 THURSDAY If your arrival time is 6:00 am, do not arrive prior to that time as the [facility] entrance doors do not open until 6:00 am.  REMEMBER: Instructions that are not followed completely may result in serious medical risk, up to and including death; or upon the discretion of your surgeon and anesthesiologist your surgery may need to be rescheduled.  Do not eat food after midnight the night before surgery.  No gum chewing, lozengers or hard candies.  You may however, drink CLEAR liquids up to 2 hours before you are scheduled to arrive for your surgery. Do not drink anything within 2 hours of your scheduled arrival time.  Clear liquids include: - water  - apple juice without pulp - gatorade (not RED colors) - black coffee or tea (Do NOT add milk or creamers to the coffee or tea) Do NOT drink anything that is not on this list.  Type 1 and Type 2 diabetics should only drink water.  TAKE THESE MEDICATIONS THE MORNING OF SURGERY WITH A SIP OF WATER: NONE   One week prior to surgery: Stop Anti-inflammatories (NSAIDS) such as Advil, Aleve, Ibuprofen, Motrin, Naproxen, Naprosyn and Aspirin based products such as Excedrin, Goodys Powder, BC Powder. Stop ANY OVER THE COUNTER supplements until after surgery. You may however, continue to take Tylenol if needed for pain up until the day of surgery.  No Alcohol for 24 hours before or after surgery.  No Smoking including e-cigarettes for 24 hours prior to surgery.  No chewable tobacco products for at least 6 hours prior to surgery.  No nicotine patches on the day of surgery.  Do not use any "recreational" drugs for at least a week prior to your surgery.  Please be advised that the combination of cocaine and  anesthesia may have negative outcomes, up to and including death. If you test positive for cocaine, your surgery will be cancelled.  On the morning of surgery brush your teeth with toothpaste and water, you may rinse your mouth with mouthwash if you wish. Do not swallow any toothpaste or mouthwash.  Use CHG Soap as directed on instruction sheet.  Do not wear jewelry, make-up, hairpins, clips or nail polish.  Do not wear lotions, powders, or perfumes, DEODORANT OR COLOGNE   Do not shave body from the neck down 48 hours prior to surgery just in case you cut yourself which could leave a site for infection.  Also, freshly shaved skin may become irritated if using the CHG soap.  Contact lenses, hearing aids and dentures may not be worn into surgery.  Do not bring valuables to the hospital. [facility] is not responsible for any missing/lost belongings or valuables.   Notify your doctor if there is any change in your medical condition (cold, fever, infection).  Wear comfortable clothing (specific to your surgery type) to the hospital.  After surgery, you can help prevent lung complications by doing breathing exercises.  Take deep breaths and cough every 1-2 hours. Your doctor may order a device called an Incentive Spirometer to help you take deep breaths. When coughing or sneezing, hold a pillow firmly against your incision with both hands. This is called "splinting." Doing this helps protect your incision. It also decreases belly discomfort.  If you  are being discharged the day of surgery, you will not be allowed to drive home. You will need a responsible adult (18 years or older) to drive you home and stay with you that night.   If you are taking public transportation, you will need to have a responsible adult (18 years or older) with you. Please confirm with your physician that it is acceptable to use public transportation.   Please call the Pre-admissions Testing Dept. at [phone] if you have any questions about these instructions.  Surgery Visitation Policy:  Patients undergoing a surgery or procedure may have two family members or support persons with them as long as the person is not COVID-19 positive or experiencing its symptoms.

## 2022-05-12 ENCOUNTER — Ambulatory Visit: Payer: 59 | Admitting: Anesthesiology

## 2022-05-12 ENCOUNTER — Other Ambulatory Visit: Payer: Self-pay

## 2022-05-12 ENCOUNTER — Ambulatory Visit
Admission: RE | Admit: 2022-05-12 | Discharge: 2022-05-12 | Disposition: A | Discharge status: Home / Self Care | Payer: 59 | Attending: General Surgery | Admitting: General Surgery

## 2022-05-12 ENCOUNTER — Encounter: Payer: Self-pay | Admitting: General Surgery

## 2022-05-12 ENCOUNTER — Encounter
Admission: RE | Disposition: A | Discharge status: Home / Self Care | Payer: Self-pay | Source: Home / Self Care | Attending: General Surgery

## 2022-05-12 DIAGNOSIS — I10 Essential (primary) hypertension: Secondary | ICD-10-CM | POA: Insufficient documentation

## 2022-05-12 DIAGNOSIS — Z96612 Presence of left artificial shoulder joint: Secondary | ICD-10-CM | POA: Insufficient documentation

## 2022-05-12 DIAGNOSIS — K4090 Unilateral inguinal hernia, without obstruction or gangrene, not specified as recurrent: Secondary | ICD-10-CM | POA: Insufficient documentation

## 2022-05-12 HISTORY — PX: INSERTION OF MESH: SHX5868

## 2022-05-12 HISTORY — PX: INGUINAL HERNIA REPAIR: SHX194

## 2022-05-12 SURGERY — REPAIR, HERNIA, INGUINAL, ADULT
Anesthesia: General | Laterality: Right

## 2022-05-12 MED ORDER — LIDOCAINE HCL (CARDIAC) PF 100 MG/5ML IV SOSY
PREFILLED_SYRINGE | INTRAVENOUS | Status: DC | PRN
  Administered 2022-05-12: 100 mg via INTRAVENOUS

## 2022-05-12 MED ORDER — MIDAZOLAM HCL 2 MG/2ML IJ SOLN
INTRAMUSCULAR | Status: DC | PRN
  Administered 2022-05-12: 2 mg via INTRAVENOUS

## 2022-05-12 MED ORDER — EPHEDRINE SULFATE (PRESSORS) 50 MG/ML IJ SOLN
INTRAMUSCULAR | Status: DC | PRN
  Administered 2022-05-12 (×3): 5 mg via INTRAVENOUS

## 2022-05-12 MED ORDER — ORAL CARE MOUTH RINSE: 15.0000 mL | Freq: Once | OROMUCOSAL | Status: AC

## 2022-05-12 MED ORDER — PROPOFOL 10 MG/ML IV BOLUS
INTRAVENOUS | Status: DC | PRN
  Administered 2022-05-12: 200 mg via INTRAVENOUS

## 2022-05-12 MED ORDER — LACTATED RINGERS IV SOLN: INTRAVENOUS | Status: DC

## 2022-05-12 MED ORDER — FENTANYL CITRATE (PF) 100 MCG/2ML IJ SOLN
INTRAMUSCULAR | Status: DC | PRN
  Administered 2022-05-12 (×2): 25 ug via INTRAVENOUS

## 2022-05-12 MED ORDER — EPHEDRINE 5 MG/ML INJ
INTRAVENOUS | Status: AC
  Filled 2022-05-12: qty 5

## 2022-05-12 MED ORDER — CHLORHEXIDINE GLUCONATE 0.12 % MT SOLN: 15.0000 mL | Freq: Once | OROMUCOSAL | Status: AC

## 2022-05-12 MED ORDER — CEFAZOLIN SODIUM-DEXTROSE 2-4 GM/100ML-% IV SOLN
INTRAVENOUS | Status: AC
  Filled 2022-05-12: qty 100

## 2022-05-12 MED ORDER — FENTANYL CITRATE (PF) 100 MCG/2ML IJ SOLN: 25.0000 ug | INTRAMUSCULAR | Status: DC | PRN

## 2022-05-12 MED ORDER — CHLORHEXIDINE GLUCONATE 0.12 % MT SOLN
OROMUCOSAL | Status: AC
  Administered 2022-05-12: 15 mL via OROMUCOSAL
  Filled 2022-05-12: qty 15

## 2022-05-12 MED ORDER — ONDANSETRON HCL 4 MG/2ML IJ SOLN
INTRAMUSCULAR | Status: AC
  Filled 2022-05-12: qty 2

## 2022-05-12 MED ORDER — CHLORHEXIDINE GLUCONATE CLOTH 2 % EX PADS: 6.0000 | MEDICATED_PAD | Freq: Once | CUTANEOUS | Status: DC

## 2022-05-12 MED ORDER — DEXAMETHASONE SODIUM PHOSPHATE 10 MG/ML IJ SOLN
INTRAMUSCULAR | Status: DC | PRN
  Administered 2022-05-12: 10 mg via INTRAVENOUS

## 2022-05-12 MED ORDER — FAMOTIDINE 20 MG PO TABS
ORAL_TABLET | ORAL | Status: AC
  Administered 2022-05-12: 20 mg via ORAL
  Filled 2022-05-12: qty 1

## 2022-05-12 MED ORDER — KETOROLAC TROMETHAMINE 30 MG/ML IJ SOLN
INTRAMUSCULAR | Status: DC | PRN
  Administered 2022-05-12: 30 mg via INTRAMUSCULAR

## 2022-05-12 MED ORDER — CEFAZOLIN SODIUM-DEXTROSE 2-4 GM/100ML-% IV SOLN
2.0000 g | INTRAVENOUS | Status: AC
  Administered 2022-05-12: 2 g via INTRAVENOUS

## 2022-05-12 MED ORDER — HYDROCODONE-ACETAMINOPHEN 5-325 MG PO TABS: 1.0000 | ORAL_TABLET | ORAL | 0 refills | Status: DC | PRN

## 2022-05-12 MED ORDER — ACETAMINOPHEN 10 MG/ML IV SOLN
INTRAVENOUS | Status: DC | PRN
  Administered 2022-05-12: 1000 mg via INTRAVENOUS

## 2022-05-12 MED ORDER — LIDOCAINE HCL (PF) 2 % IJ SOLN
INTRAMUSCULAR | Status: AC
  Filled 2022-05-12: qty 5

## 2022-05-12 MED ORDER — BUPIVACAINE-EPINEPHRINE (PF) 0.5% -1:200000 IJ SOLN
INTRAMUSCULAR | Status: DC | PRN
  Administered 2022-05-12: 30 mL

## 2022-05-12 MED ORDER — FAMOTIDINE 20 MG PO TABS: 20.0000 mg | ORAL_TABLET | Freq: Once | ORAL | Status: AC

## 2022-05-12 MED ORDER — KETOROLAC TROMETHAMINE 30 MG/ML IJ SOLN
INTRAMUSCULAR | Status: AC
  Filled 2022-05-12: qty 1

## 2022-05-12 MED ORDER — MIDAZOLAM HCL 2 MG/2ML IJ SOLN
INTRAMUSCULAR | Status: AC
  Filled 2022-05-12: qty 2

## 2022-05-12 MED ORDER — BUPIVACAINE-EPINEPHRINE (PF) 0.5% -1:200000 IJ SOLN
INTRAMUSCULAR | Status: AC
  Filled 2022-05-12: qty 30

## 2022-05-12 MED ORDER — ONDANSETRON HCL 4 MG/2ML IJ SOLN: 4.0000 mg | Freq: Once | INTRAMUSCULAR | Status: DC | PRN

## 2022-05-12 MED ORDER — ONDANSETRON HCL 4 MG/2ML IJ SOLN
INTRAMUSCULAR | Status: DC | PRN
  Administered 2022-05-12: 4 mg via INTRAVENOUS

## 2022-05-12 MED ORDER — FENTANYL CITRATE (PF) 100 MCG/2ML IJ SOLN
INTRAMUSCULAR | Status: AC
  Filled 2022-05-12: qty 2

## 2022-05-12 MED ORDER — PROPOFOL 10 MG/ML IV BOLUS
INTRAVENOUS | Status: AC
  Filled 2022-05-12: qty 20

## 2022-05-12 MED ORDER — ACETAMINOPHEN 10 MG/ML IV SOLN
INTRAVENOUS | Status: AC
  Filled 2022-05-12: qty 100

## 2022-05-12 SURGICAL SUPPLY — 37 items
APL PRP STRL LF DISP 70% ISPRP (MISCELLANEOUS) ×2
BLADE SURG 15 STRL SS SAFETY (BLADE) ×6 IMPLANT
CHLORAPREP W/TINT 26 (MISCELLANEOUS) ×3 IMPLANT
DRAIN PENROSE 12X.25 LTX STRL (MISCELLANEOUS) ×3 IMPLANT
DRAPE LAPAROTOMY 100X77 ABD (DRAPES) ×3 IMPLANT
DRSG TEGADERM 4X4.75 (GAUZE/BANDAGES/DRESSINGS) ×3 IMPLANT
DRSG TELFA 3X4 N-ADH STERILE (GAUZE/BANDAGES/DRESSINGS) ×3 IMPLANT
ELECT REM PT RETURN 9FT ADLT (ELECTROSURGICAL) ×2
ELECTRODE REM PT RTRN 9FT ADLT (ELECTROSURGICAL) ×3 IMPLANT
GAUZE 4X4 16PLY ~~LOC~~+RFID DBL (SPONGE) ×3 IMPLANT
GLOVE BIO SURGEON STRL SZ7.5 (GLOVE) ×3 IMPLANT
GLOVE SURG UNDER LTX SZ8 (GLOVE) ×3 IMPLANT
GOWN STRL REUS W/ TWL LRG LVL3 (GOWN DISPOSABLE) ×6 IMPLANT
GOWN STRL REUS W/TWL LRG LVL3 (GOWN DISPOSABLE) ×4
KIT TURNOVER KIT A (KITS) ×3 IMPLANT
LABEL OR SOLS (LABEL) ×3 IMPLANT
MANIFOLD NEPTUNE II (INSTRUMENTS) ×3 IMPLANT
MESH HERNIA 6X12 ULTRAPRO MED (Mesh General) IMPLANT
MESH HERNIA ULTRAPRO MED (Mesh General) ×2 IMPLANT
NEEDLE HYPO 22GX1.5 SAFETY (NEEDLE) ×6 IMPLANT
PACK BASIN MINOR ARMC (MISCELLANEOUS) ×3 IMPLANT
SPIKE FLUID TRANSFER (MISCELLANEOUS) ×3 IMPLANT
STRIP CLOSURE SKIN 1/2X4 (GAUZE/BANDAGES/DRESSINGS) ×3 IMPLANT
SUT PDS AB 0 CT1 27 (SUTURE) IMPLANT
SUT SURGILON 0 BLK (SUTURE) ×3 IMPLANT
SUT VIC AB 2-0 SH 27 (SUTURE) ×2
SUT VIC AB 2-0 SH 27XBRD (SUTURE) ×3 IMPLANT
SUT VIC AB 3-0 54X BRD REEL (SUTURE) ×3 IMPLANT
SUT VIC AB 3-0 BRD 54 (SUTURE) ×2
SUT VIC AB 3-0 SH 27 (SUTURE) ×2
SUT VIC AB 3-0 SH 27X BRD (SUTURE) ×3 IMPLANT
SUT VIC AB 4-0 FS2 27 (SUTURE) ×3 IMPLANT
SWABSTK COMLB BENZOIN TINCTURE (MISCELLANEOUS) ×3 IMPLANT
SYR 10ML LL (SYRINGE) ×3 IMPLANT
SYR 3ML LL SCALE MARK (SYRINGE) ×3 IMPLANT
TRAP FLUID SMOKE EVACUATOR (MISCELLANEOUS) ×3 IMPLANT
WATER STERILE IRR 500ML POUR (IV SOLUTION) ×3 IMPLANT

## 2022-05-12 NOTE — Anesthesia Preprocedure Evaluation (Signed)
Anesthesia Evaluation  Patient identified by MRN, date of birth, ID band Patient awake    Reviewed: Allergy & Precautions, NPO status , Patient's Chart, lab work & pertinent test results  Airway Mallampati: II  TM Distance: >3 FB Neck ROM: full    Dental  (+) Teeth Intact   Pulmonary neg pulmonary ROS,    Pulmonary exam normal breath sounds clear to auscultation       Cardiovascular Exercise Tolerance: Good hypertension, Pt. on medications negative cardio ROS Normal cardiovascular exam Rhythm:Regular Rate:Normal     Neuro/Psych  Headaches, Anxiety negative neurological ROS  negative psych ROS   GI/Hepatic negative GI ROS, Neg liver ROS,   Endo/Other  negative endocrine ROS  Renal/GU negative Renal ROS  negative genitourinary   Musculoskeletal  (+) Arthritis ,   Abdominal Normal abdominal exam  (+)   Peds negative pediatric ROS (+)  Hematology negative hematology ROS (+)   Anesthesia Other Findings Past Medical History: No date: Anxiety No date: Arthritis No date: Carpal tunnel syndrome No date: Ruptured lumbar disc No date: Shoulder dislocation No date: Sinusitis  Past Surgical History: No date: BACK SURGERY 2016: CARPAL TUNNEL RELEASE; Right     Comment:  [facility] Blight 2020: CARPAL TUNNEL RELEASE; Left 02/01/2022: COLONOSCOPY WITH PROPOFOL; N/A     Comment:  Procedure: COLONOSCOPY WITH PROPOFOL;  Surgeon: Robert Bellow, MD;  Location: [facility] ENDOSCOPY;  Service:               Endoscopy;  Laterality: N/A; 1995: LUMBAR LAMINECTOMY/DECOMPRESSION MICRODISCECTOMY     Comment:  [facility] 2000: NASAL SINUS SURGERY 1987: OPEN ANTERIOR SHOULDER RECONSTRUCTION; Left     Comment:  secondary to football dislocation 04/12/2018: REVERSE SHOULDER ARTHROPLASTY; Left     Comment:  Procedure: LEFT SHOULDER ANATOMOC VS. REVERSE SHOULDER               ARTHROPLASTY;  Surgeon: Netta Cedars, MD;  Location: [place];  Service: Orthopedics;  Laterality: Left; 04/12/2018: shoulder replacement; Left     Comment:  Anatomical  BMI    Body Mass Index: 30.13 kg/m      Reproductive/Obstetrics negative OB ROS                             Anesthesia Physical Anesthesia Plan  ASA: 2  Anesthesia Plan: General   Post-op Pain Management:    Induction: Intravenous  PONV Risk Score and Plan: Dexamethasone, Ondansetron, Midazolam and Treatment may vary due to age or medical condition  Airway Management Planned: LMA  Additional Equipment:   Intra-op Plan:   Post-operative Plan: Extubation in OR  Informed Consent: I have reviewed the patients History and Physical, chart, labs and discussed the procedure including the risks, benefits and alternatives for the proposed anesthesia with the patient or authorized representative who has indicated his/her understanding and acceptance.     Dental Advisory Given  Plan Discussed with: CRNA and Surgeon  Anesthesia Plan Comments:         Anesthesia Quick Evaluation

## 2022-05-12 NOTE — Anesthesia Procedure Notes (Signed)
Procedure Name: LMA Insertion Date/Time: 05/12/2022 7:37 AM  Performed by: Natasha Mead, CRNAPre-anesthesia Checklist: Patient identified, Patient being monitored, Timeout performed, Emergency Drugs available and Suction available Patient Re-evaluated:Patient Re-evaluated prior to induction Oxygen Delivery Method: Circle system utilized Preoxygenation: Pre-oxygenation with 100% oxygen Induction Type: IV induction Ventilation: Mask ventilation without difficulty LMA: LMA inserted LMA Size: 4.0 Tube type: Oral Number of attempts: 1 Placement Confirmation: positive ETCO2 and breath sounds checked- equal and bilateral Tube secured with: Tape Dental Injury: Teeth and Oropharynx as per pre-operative assessment

## 2022-05-12 NOTE — Transfer of Care (Signed)
Immediate Anesthesia Transfer of Care Note  Patient: Justin Jefferson  Procedure(s) Performed: HERNIA REPAIR INGUINAL ADULT (Right) INSERTION OF MESH  Patient Location: PACU  Anesthesia Type:General  Level of Consciousness: awake and alert   Airway & Oxygen Therapy: Patient Spontanous Breathing and Patient connected to face mask oxygen  Post-op Assessment: Report given to RN and Post -op Vital signs reviewed and stable  Post vital signs: Reviewed and stable  Last Vitals:  Vitals Value Taken Time  BP 128/74 05/12/22 0835  Temp 36.4 C 05/12/22 0835  Pulse 65 05/12/22 0836  Resp 17 05/12/22 0836  SpO2 100 % 05/12/22 0836  Vitals shown include unvalidated device data.  Last Pain:  Vitals:   05/12/22 0835  TempSrc:   PainSc: 0-No pain         Complications: No notable events documented.

## 2022-05-12 NOTE — Anesthesia Postprocedure Evaluation (Signed)
Anesthesia Post Note  Patient: Justin Jefferson  Procedure(s) Performed: HERNIA REPAIR INGUINAL ADULT (Right) INSERTION OF MESH  Patient location during evaluation: PACU Anesthesia Type: General Level of consciousness: awake and oriented Pain management: satisfactory to patient Vital Signs Assessment: post-procedure vital signs reviewed and stable Respiratory status: spontaneous breathing and respiratory function stable Cardiovascular status: stable Anesthetic complications: no   No notable events documented.   Last Vitals:  Vitals:   05/12/22 0845 05/12/22 0900  BP: 136/65 (!) 141/76  Pulse: 71 72  Resp: 18 (!) 23  Temp:  36.4 C  SpO2: 97% 96%    Last Pain:  Vitals:   05/12/22 0900  TempSrc:   PainSc: 0-No pain                 VAN STAVEREN,Raydan Schlabach
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
## 2022-05-12 NOTE — Op Note (Signed)
Preoperative diagnosis: Symptomatic right inguinal hernia.  Postoperative diagnosis: Same, lipoma of the cord.  Operative procedure: Excision of lipoma of the cord, repair of indirect inguinal hernia with medium Ultra Pro mesh.  Operating surgeon: Hervey Ard, MD.  Anesthesia: General by LMA, Marcaine 0.5% with 1: 200,000's of epinephrine, 30 cc; Toradol: 30 mg.  EBL: 2 cc.  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  Admitted for elective repair.  Hair was removed from the surgical site with clippers prior to the procedure.  SCD stockings for DVT prevention.  He had Ancef administered on induction of anesthesia.  Operative note: With the patient under adequate general anesthesia the area was cleansed with ChloraPrep and draped.  Field block anesthesia was established for postoperative analgesia.  A 5 cm skin line incision along the anticipated course the inguinal ligament was made.  Skin was incised sharply remaining dissection electrocautery.  Hemostasis was with 3-0 Vicryl ties.  The external oblique was opened in the direction of its fibers.  The ilioinguinal and iliohypogastric nerves were identified and protected.  The cremasteric fibers were divided and a sizable lipoma of the cord and dissected back to level the internal ring where it was controlled with a 3-0 Vicryl tie excised and discarded.  The hernia sac was mobilized circumferentially and into the preperitoneal space.  There was no evidence of a direct defect.  A medium Ultra Pro mesh was smoothed into position in the preperitoneal space and the external component along the floor the inguinal canal.  This was anchored to the pubic tubercle with 0 Surgilon and then along the inguinal ligament with interrupted sutures.  A lateral slit was made for cord passage.  The medial and superior borders of the mesh were anchored to the transverse abdominis aponeurosis with interrupted 0 Surgilon sutures.   The nerves were returned to their bed.  Toradol was placed into the wound.  The external oblique was closed with a running 2-0 Vicryl suture.  Scarpa's fascia was closed with a running 3-0 Vicryl suture and the skin closed with a running 4-0 Vicryl subcuticular suture.  Benzoin, Steri-Strips, Telfa and Tegaderm dressing were applied.  Patient tolerated procedure well and was taken to PACU in stable condition.

## 2022-05-15 ENCOUNTER — Encounter: Payer: Self-pay | Admitting: General Surgery

## 2022-05-16 ENCOUNTER — Ambulatory Visit: Payer: 59 | Admitting: Internal Medicine

## 2022-05-24 ENCOUNTER — Ambulatory Visit (INDEPENDENT_AMBULATORY_CARE_PROVIDER_SITE_OTHER): Payer: 59 | Admitting: Internal Medicine

## 2022-05-24 ENCOUNTER — Encounter: Payer: Self-pay | Admitting: Internal Medicine

## 2022-05-24 VITALS — BP 134/88 | HR 66 | Temp 98.5°F | Ht 70.0 in | Wt 212.4 lb

## 2022-05-24 DIAGNOSIS — Z125 Encounter for screening for malignant neoplasm of prostate: Secondary | ICD-10-CM

## 2022-05-24 DIAGNOSIS — Z23 Encounter for immunization: Secondary | ICD-10-CM | POA: Diagnosis not present

## 2022-05-24 DIAGNOSIS — G43109 Migraine with aura, not intractable, without status migrainosus: Secondary | ICD-10-CM

## 2022-05-24 DIAGNOSIS — R5382 Chronic fatigue, unspecified: Secondary | ICD-10-CM | POA: Diagnosis not present

## 2022-05-24 DIAGNOSIS — Z0000 Encounter for general adult medical examination without abnormal findings: Secondary | ICD-10-CM

## 2022-05-24 DIAGNOSIS — E7800 Pure hypercholesterolemia, unspecified: Secondary | ICD-10-CM | POA: Diagnosis not present

## 2022-05-24 DIAGNOSIS — I10 Essential (primary) hypertension: Secondary | ICD-10-CM | POA: Diagnosis not present

## 2022-05-24 DIAGNOSIS — R7301 Impaired fasting glucose: Secondary | ICD-10-CM
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
## 2022-05-24 NOTE — Assessment & Plan Note (Signed)
2 or 3 since retirement, brought on by bright light lasting 20 minutes

## 2022-05-25 ENCOUNTER — Other Ambulatory Visit (INDEPENDENT_AMBULATORY_CARE_PROVIDER_SITE_OTHER): Payer: 59

## 2022-05-25 DIAGNOSIS — E7800 Pure hypercholesterolemia, unspecified: Secondary | ICD-10-CM

## 2022-05-25 DIAGNOSIS — R5382 Chronic fatigue, unspecified: Secondary | ICD-10-CM

## 2022-05-25 DIAGNOSIS — Z125 Encounter for screening for malignant neoplasm of prostate: Secondary | ICD-10-CM | POA: Diagnosis not present

## 2022-05-25 DIAGNOSIS — R7301 Impaired fasting glucose: Secondary | ICD-10-CM

## 2022-05-25 DIAGNOSIS — Z0000 Encounter for general adult medical examination without abnormal findings: Secondary | ICD-10-CM | POA: Insufficient documentation

## 2022-05-25 LAB — COMPREHENSIVE METABOLIC PANEL
ALT: 34 U/L (ref 0–53)
AST: 20 U/L (ref 0–37)
Albumin: 4.1 g/dL (ref 3.5–5.2)
Alkaline Phosphatase: 94 U/L (ref 39–117)
BUN: 12 mg/dL (ref 6–23)
CO2: 23 mEq/L (ref 19–32)
Calcium: 8.9 mg/dL (ref 8.4–10.5)
Chloride: 105 mEq/L (ref 96–112)
Creatinine, Ser: 1.32 mg/dL (ref 0.40–1.50)
GFR: 61.95 mL/min (ref >60.00)
Glucose, Bld: 96 mg/dL (ref 70–99)
Potassium: 4.4 mEq/L (ref 3.5–5.1)
Sodium: 138 mEq/L (ref 135–145)
Total Bilirubin: 0.7 mg/dL (ref 0.2–1.2)
Total Protein: 6.3 g/dL (ref 6.0–8.3)

## 2022-05-25 LAB — CBC WITH DIFFERENTIAL/PLATELET
Basophils Absolute: 0.1 10*3/uL (ref 0.0–0.1)
Basophils Relative: 1.1 % (ref 0.0–3.0)
Eosinophils Absolute: 0.4 10*3/uL (ref 0.0–0.7)
Eosinophils Relative: 7 % — ABNORMAL HIGH (ref 0.0–5.0)
HCT: 45 % (ref 39.0–52.0)
Hemoglobin: 15.5 g/dL (ref 13.0–17.0)
Lymphocytes Relative: 27.9 % (ref 12.0–46.0)
Lymphs Abs: 1.6 10*3/uL (ref 0.7–4.0)
MCHC: 34.4 g/dL (ref 30.0–36.0)
MCV: 98 fl (ref 78.0–100.0)
Monocytes Absolute: 0.4 10*3/uL (ref 0.1–1.0)
Monocytes Relative: 6.1 % (ref 3.0–12.0)
Neutro Abs: 3.4 10*3/uL (ref 1.4–7.7)
Neutrophils Relative %: 57.9 % (ref 43.0–77.0)
Platelets: 164 10*3/uL (ref 150.0–400.0)
RBC: 4.59 Mil/uL (ref 4.22–5.81)
RDW: 13.1 % (ref 11.5–15.5)
WBC: 5.8 10*3/uL (ref 4.0–10.5)

## 2022-05-25 LAB — HEMOGLOBIN A1C: Hgb A1c MFr Bld: 5.2 % (ref 4.6–6.5)

## 2022-05-25 LAB — LIPID PANEL
Cholesterol: 232 mg/dL — ABNORMAL HIGH (ref 0–200)
HDL: 54.1 mg/dL (ref >39.00)
LDL Cholesterol: 138 mg/dL — ABNORMAL HIGH (ref 0–99)
NonHDL: 178.16
Total CHOL/HDL Ratio: 4
Triglycerides: 199 mg/dL — ABNORMAL HIGH (ref 0.0–149.0)
VLDL: 39.8 mg/dL (ref 0.0–40.0)

## 2022-05-25 LAB — PSA: PSA: 0.68 ng/mL (ref 0.10–4.00)

## 2022-05-25 NOTE — Assessment & Plan Note (Signed)
Hypertension has resolved by home readings

## 2022-05-25 NOTE — Assessment & Plan Note (Signed)

## 2022-10-30 DIAGNOSIS — J329 Chronic sinusitis, unspecified: Secondary | ICD-10-CM | POA: Diagnosis not present

## 2022-10-30 DIAGNOSIS — J029 Acute pharyngitis, unspecified: Secondary | ICD-10-CM | POA: Diagnosis not present

## 2022-10-30 DIAGNOSIS — Z20822 Contact with and (suspected) exposure to covid-19: Secondary | ICD-10-CM | POA: Diagnosis not present

## 2022-12-29 ENCOUNTER — Other Ambulatory Visit: Payer: Self-pay | Admitting: Family

## 2023-04-19 ENCOUNTER — Encounter: Payer: Self-pay | Admitting: Internal Medicine

## 2023-04-23 MED ORDER — MELOXICAM 15 MG PO TABS: 15.0000 mg | ORAL_TABLET | Freq: Every day | ORAL | 2 refills | Status: DC

## 2023-05-28 ENCOUNTER — Ambulatory Visit (INDEPENDENT_AMBULATORY_CARE_PROVIDER_SITE_OTHER): Payer: 59 | Admitting: Internal Medicine

## 2023-05-28 VITALS — BP 160/100 | HR 65 | Temp 98.4°F | Ht 70.0 in | Wt 208.1 lb

## 2023-05-28 DIAGNOSIS — G43109 Migraine with aura, not intractable, without status migrainosus: Secondary | ICD-10-CM

## 2023-05-28 DIAGNOSIS — R7301 Impaired fasting glucose: Secondary | ICD-10-CM | POA: Diagnosis not present

## 2023-05-28 DIAGNOSIS — I10 Essential (primary) hypertension: Secondary | ICD-10-CM | POA: Diagnosis not present

## 2023-05-28 DIAGNOSIS — E7800 Pure hypercholesterolemia, unspecified: Secondary | ICD-10-CM | POA: Diagnosis not present

## 2023-05-28 DIAGNOSIS — R5383 Other fatigue: Secondary | ICD-10-CM | POA: Diagnosis not present

## 2023-05-28 DIAGNOSIS — Z1159 Encounter for screening for other viral diseases: Secondary | ICD-10-CM

## 2023-05-28 DIAGNOSIS — Z125 Encounter for screening for malignant neoplasm of prostate: Secondary | ICD-10-CM

## 2023-05-28 DIAGNOSIS — Z0000 Encounter for general adult medical examination without abnormal findings: Secondary | ICD-10-CM

## 2023-05-28 DIAGNOSIS — Z23 Encounter for immunization: Secondary | ICD-10-CM | POA: Diagnosis not present

## 2023-05-28 DIAGNOSIS — E785 Hyperlipidemia, unspecified: Secondary | ICD-10-CM

## 2023-05-28 DIAGNOSIS — Z114 Encounter for screening for human immunodeficiency virus [HIV]: Secondary | ICD-10-CM | POA: Diagnosis not present

## 2023-05-28 NOTE — Assessment & Plan Note (Addendum)
Hypertension had resolved by home readings   but he is elevated today and he has not checked lately. He reports using salt lliberally and currently using 15 mg meloxicam for right thumb trigger finger.

## 2023-05-28 NOTE — Assessment & Plan Note (Signed)

## 2023-05-28 NOTE — Progress Notes (Signed)
Patient ID: Justin Jefferson, male    DOB: 07/15/1971  Age: 52 y.o. MRN: [id number]  The patient is here for annual preventive examination and management of other chronic and acute problems.   The risk factors are reflected in the social history.   The roster of all physicians providing medical care to patient - is listed in the Snapshot section of the chart.   Activities of daily living:  The patient is 100% independent in all ADLs: dressing, toileting, feeding as well as independent mobility   Home safety : The patient has smoke detectors in the home. They wear seatbelts.  There are no unsecured firearms at home. There is no violence in the home.    There is no risks for hepatitis, STDs or HIV. There is no   history of blood transfusion. They have no travel history to infectious disease endemic areas of the world.   The patient has seen their dentist in the last six month. They have seen their eye doctor in the last year. The patinet  denies slight hearing difficulty with regard to whispered voices and some television programs.  They have deferred audiologic testing in the last year.  They do not  have excessive sun exposure. Discussed the need for sun protection: hats, long sleeves and use of sunscreen if there is significant sun exposure.    Diet: the importance of a healthy diet is discussed. They do have a healthy diet.   The benefits of regular aerobic exercise were discussed. The patient  exercises  3 to 5 days per week  for  60 minutes.    Depression screen: there are no signs or vegative symptoms of depression- irritability, change in appetite, anhedonia, sadness/tearfullness.   The following portions of the patient's history were reviewed and updated as appropriate: allergies, current medications, past family history, past medical history,  past surgical history, past social history  and problem list.   Visual acuity was not assessed per patient preference since the patient has regular  follow up with an  ophthalmologist. Hearing and body mass index were assessed and reviewed.    During the course of the visit the patient was educated and counseled about appropriate screening and preventive services including : fall prevention , diabetes screening, nutrition counseling, colorectal cancer screening, and recommended immunizations.    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  Has not checked BP lately,.  Elevated here in the office.   Review of Symptoms  Patient denies headache, fevers, malaise, unintentional weight loss, skin rash, eye pain, sinus congestion and sinus pain, sore throat, dysphagia,  hemoptysis , cough, dyspnea, wheezing, chest pain, palpitations, orthopnea, edema, abdominal pain, nausea, melena, diarrhea, constipation, flank pain, dysuria, hematuria, urinary  Frequency, nocturia, numbness, tingling, seizures,  Focal weakness, Loss of consciousness,  Tremor, insomnia, depression, anxiety, and suicidal ideation.    Physical Exam:  BP (!) 160/100   Pulse 65   Temp 98.4 F (36.9 C) (Oral)   Ht 5\' 10"  (1.778 m)   Wt 208 lb 1.9 oz (94.4 kg)   SpO2 97%   BMI 29.86 kg/m    Physical Exam Vitals reviewed.  Constitutional:      General: He is not in acute distress.    Appearance: Normal appearance.  He is normal weight. He is not ill-appearing, toxic-appearing or diaphoretic.  HENT:     Head: Normocephalic and atraumatic.     Right Ear: Tympanic membrane, ear canal and external ear normal. There is no impacted cerumen.     Left Ear: Tympanic membrane, ear canal and external ear normal. There is no impacted cerumen.     Nose: Nose normal.     Mouth/Throat:     Mouth: Mucous membranes are moist.     Pharynx: Oropharynx is clear.  Eyes:     General: No  scleral icterus.       Right eye: No discharge.        Left eye: No discharge.     Conjunctiva/sclera: Conjunctivae normal.  Neck:     Thyroid: No thyromegaly.     Vascular: No carotid bruit or JVD.  Cardiovascular:     Rate and Rhythm: Normal rate and regular rhythm.     Heart sounds: Normal heart sounds.  Pulmonary:     Effort: Pulmonary effort is normal. No respiratory distress.     Breath sounds: Normal breath sounds.  Abdominal:     General: Bowel sounds are normal.     Palpations: Abdomen is soft. There is no mass.     Tenderness: There is no abdominal tenderness. There is no guarding or rebound.  Musculoskeletal:        General: Normal range of motion.     Cervical back: Normal range of motion and neck supple.  Lymphadenopathy:     Cervical: No cervical adenopathy.  Skin:    General: Skin is warm and dry.  Neurological:     General: No focal deficit present.     Mental Status: He is alert and oriented to person, place, and time. Mental status is at baseline.  Psychiatric:        Mood and Affect: Mood normal.        Behavior: Behavior normal.        Thought Content: Thought content normal.        Judgment: Judgment normal.    Assessment and Plan: Impaired fasting glucose -     Comprehensive metabolic panel -     Hemoglobin A1c  Prostate cancer screening -     PSA  Pure hypercholesterolemia -     Lipid panel -     LDL cholesterol, direct  Other fatigue -     CBC with Differential/Platelet -     TSH  Encounter for screening for HIV -     HIV Antibody (routine testing w rflx)  Need for hepatitis C screening test -     Hepatitis C antibody  Encounter for preventive health examination Assessment & Plan: age appropriate education and counseling updated, referrals for preventative services and immunizations addressed, dietary and smoking counseling addressed, most recent labs reviewed.  I have personally reviewed and have noted:   1) the patient's medical  and social history 2) The pt's use of alcohol, tobacco, and illicit drugs 3) The patient's current medications and supplements 4) Functional ability including ADL's, fall risk, home safety risk, hearing and visual impairment 5) Diet and physical activities 6) Evidence for depression or mood disorder 7) The patient's height, weight, and BMI have been recorded in the chartI have made referrals, and provided counseling and education based on review of the above    White coat syndrome with diagnosis of hypertension Assessment & Plan: Hypertension had resolved by home readings   but he  is elevated today and he has not checked lately. He reports using salt lliberally and currently using 15 mg meloxicam for right thumb trigger finger.   Orders: -     Microalbumin / creatinine urine ratio  Ocular migraine Assessment & Plan: Recurrent.  Will consider starting a beta blocker for BP management for prevention    Need for influenza vaccination -     Flu vaccine trivalent PF, 6mos and older(Flulaval,Afluria,Fluarix,Fluzone)  Hyperlipidemia with target LDL less than 160 Assessment & Plan: Based on current lipid profile, the risk of clinically significant CAD is 8%  over the next 10 years, RYR recommended.      No follow-ups on file.  Sherlene Shams, MD

## 2023-05-28 NOTE — Assessment & Plan Note (Signed)
Recurrent.  Will consider starting a beta blocker for BP management for prevention

## 2023-05-28 NOTE — Patient Instructions (Signed)
YOUR BLOOD PRESSURE IS VERY HIGH TODAY  It should be 130/80 or less.  Please check your blood pressure a few times at home and send me the readings so I can determine if you need to start  MEDICATION.  Remember that salt and NSAIDS (meloxicam) can raise blood pressure  Use tylenol 1000 mg twice daily instead for a few days to monitor for any changes  in BP attributed to meloxicam

## 2023-05-29 LAB — LIPID PANEL
Cholesterol: 255 mg/dL — ABNORMAL HIGH (ref 0–200)
HDL: 57.3 mg/dL
LDL Cholesterol: 120 mg/dL — ABNORMAL HIGH (ref 0–99)
NonHDL: 197.83
Total CHOL/HDL Ratio: 4
Triglycerides: 390 mg/dL — ABNORMAL HIGH (ref 0.0–149.0)
VLDL: 78 mg/dL — ABNORMAL HIGH (ref 0.0–40.0)

## 2023-05-29 LAB — CBC WITH DIFFERENTIAL/PLATELET
Basophils Absolute: 0 K/uL (ref 0.0–0.1)
Basophils Relative: 0.6 % (ref 0.0–3.0)
Eosinophils Absolute: 0.2 K/uL (ref 0.0–0.7)
Eosinophils Relative: 3.6 % (ref 0.0–5.0)
HCT: 48.9 % (ref 39.0–52.0)
Hemoglobin: 16.5 g/dL (ref 13.0–17.0)
Lymphocytes Relative: 29.1 % (ref 12.0–46.0)
Lymphs Abs: 1.7 K/uL (ref 0.7–4.0)
MCHC: 33.7 g/dL (ref 30.0–36.0)
MCV: 99.5 fl (ref 78.0–100.0)
Monocytes Absolute: 0.3 K/uL (ref 0.1–1.0)
Monocytes Relative: 5.7 % (ref 3.0–12.0)
Neutro Abs: 3.7 K/uL (ref 1.4–7.7)
Neutrophils Relative %: 61 % (ref 43.0–77.0)
Platelets: 189 K/uL (ref 150.0–400.0)
RBC: 4.92 Mil/uL (ref 4.22–5.81)
RDW: 12.3 % (ref 11.5–15.5)
WBC: 6 K/uL (ref 4.0–10.5)

## 2023-05-29 LAB — COMPREHENSIVE METABOLIC PANEL
ALT: 21 U/L (ref 0–53)
AST: 16 U/L (ref 0–37)
Albumin: 4.3 g/dL (ref 3.5–5.2)
Alkaline Phosphatase: 78 U/L (ref 39–117)
BUN: 14 mg/dL (ref 6–23)
CO2: 26 meq/L (ref 19–32)
Calcium: 9.5 mg/dL (ref 8.4–10.5)
Chloride: 104 meq/L (ref 96–112)
Creatinine, Ser: 1.25 mg/dL (ref 0.40–1.50)
GFR: 65.67 mL/min (ref >60.00)
Glucose, Bld: 80 mg/dL (ref 70–99)
Potassium: 4.4 meq/L (ref 3.5–5.1)
Sodium: 138 meq/L (ref 135–145)
Total Bilirubin: 0.6 mg/dL (ref 0.2–1.2)
Total Protein: 6.6 g/dL (ref 6.0–8.3)

## 2023-05-29 LAB — MICROALBUMIN / CREATININE URINE RATIO
Creatinine,U: 201.4 mg/dL
Microalb Creat Ratio: 0.4 mg/g (ref 0.0–30.0)
Microalb, Ur: 0.8 mg/dL (ref 0.0–1.9)

## 2023-05-29 LAB — HEPATITIS C ANTIBODY: Hepatitis C Ab: NONREACTIVE

## 2023-05-29 LAB — LDL CHOLESTEROL, DIRECT: Direct LDL: 152 mg/dL

## 2023-05-29 LAB — PSA: PSA: 0.78 ng/mL (ref 0.10–4.00)

## 2023-05-29 LAB — HIV ANTIBODY (ROUTINE TESTING W REFLEX): HIV 1&2 Ab, 4th Generation: NONREACTIVE

## 2023-05-29 LAB — HEMOGLOBIN A1C: Hgb A1c MFr Bld: 4.9 % (ref 4.6–6.5)

## 2023-05-29 LAB — TSH: TSH: 1.47 u[IU]/mL (ref 0.35–5.50)

## 2023-05-30 DIAGNOSIS — E785 Hyperlipidemia, unspecified: Secondary | ICD-10-CM | POA: Insufficient documentation

## 2023-05-30 NOTE — Assessment & Plan Note (Signed)
Based on current lipid profile, the risk of clinically significant CAD is 8%  over the next 10 years, RYR recommended.

## 2023-07-13 ENCOUNTER — Other Ambulatory Visit: Payer: Self-pay | Admitting: Internal Medicine

## 2023-10-12 ENCOUNTER — Other Ambulatory Visit: Payer: Self-pay | Admitting: Internal Medicine

## 2024-01-10 ENCOUNTER — Other Ambulatory Visit: Payer: Self-pay | Admitting: Internal Medicine

## 2024-04-10 ENCOUNTER — Other Ambulatory Visit: Payer: Self-pay | Admitting: Internal Medicine

## 2024-05-23 ENCOUNTER — Ambulatory Visit: Admitting: Internal Medicine

## 2024-05-23 VITALS — BP 136/80 | HR 68 | Temp 98.1°F | Ht 70.0 in | Wt 214.8 lb

## 2024-05-23 DIAGNOSIS — E785 Hyperlipidemia, unspecified: Secondary | ICD-10-CM

## 2024-05-23 DIAGNOSIS — Z23 Encounter for immunization: Secondary | ICD-10-CM | POA: Diagnosis not present

## 2024-05-23 DIAGNOSIS — I10 Essential (primary) hypertension: Secondary | ICD-10-CM | POA: Diagnosis not present

## 2024-05-23 DIAGNOSIS — M19112 Post-traumatic osteoarthritis, left shoulder: Secondary | ICD-10-CM | POA: Diagnosis not present

## 2024-05-23 DIAGNOSIS — Z0000 Encounter for general adult medical examination without abnormal findings: Secondary | ICD-10-CM | POA: Diagnosis not present

## 2024-05-23 DIAGNOSIS — Z125 Encounter for screening for malignant neoplasm of prostate: Secondary | ICD-10-CM | POA: Diagnosis not present

## 2024-05-23 MED ORDER — MELOXICAM 15 MG PO TABS: 15.0000 mg | ORAL_TABLET | Freq: Every day | ORAL | 3 refills | Status: AC

## 2024-05-23 MED ORDER — TRAZODONE HCL 50 MG PO TABS: 50.0000 mg | ORAL_TABLET | Freq: Every evening | ORAL | 2 refills | Status: AC | PRN

## 2024-05-23 NOTE — Assessment & Plan Note (Signed)

## 2024-05-23 NOTE — Progress Notes (Signed)
 Patient ID: Justin Jefferson, male    DOB: 10/31/1970  Age: 53 y.o. MRN: [id number]  The patient is here for annual preventive examination and management of other chronic and acute problems.   The risk factors are reflected in the social history.   The roster of all physicians providing medical care to patient - is listed in the Snapshot section of the chart.   Activities of daily living:  The patient is 100% independent in all ADLs: dressing, toileting, feeding as well as independent mobility   Home safety : The patient has smoke detectors in the home. They wear seatbelts.  There are no unsecured firearms at home. There is no violence in the home.    There is no risks for hepatitis, STDs or HIV. There is no   history of blood transfusion. They have no travel history to infectious disease endemic areas of the world.   The patient has seen their dentist in the last six month. They have seen their eye doctor in the last year. The patinet  denies slight hearing difficulty with regard to whispered voices and some television programs.  They have deferred audiologic testing in the last year.  They do not  have excessive sun exposure. Discussed the need for sun protection: hats, long sleeves and use of sunscreen if there is significant sun exposure.    Diet: the importance of a healthy diet is discussed. They do have a healthy diet.   The benefits of regular aerobic exercise were discussed. The patient  exercises  3 to 5 days per week  for  60 minutes.    Depression screen: there are no signs or vegative symptoms of depression- irritability, change in appetite, anhedonia, sadness/tearfullness.   The following portions of the patient's history were reviewed and updated as appropriate: allergies, current medications, past family history, past medical history,  past surgical history, past social history  and problem list.   Visual acuity was not assessed per patient preference since the patient has regular  follow up with an  ophthalmologist. Hearing and body mass index were assessed and reviewed.    During the course of the visit the patient was educated and counseled about appropriate screening and preventive services including : fall prevention , diabetes screening, nutrition counseling, colorectal cancer screening, and recommended immunizations.    Chief Complaint:  None.  He has retired from [employer] and feels significantly less stressed    Review of Symptoms  Patient denies headache, fevers, malaise, unintentional weight loss, skin rash, eye pain, sinus congestion and sinus pain, sore throat, dysphagia,  hemoptysis , cough, dyspnea, wheezing, chest pain, palpitations, orthopnea, edema, abdominal pain, nausea, melena, diarrhea, constipation, flank pain, dysuria, hematuria, urinary  Frequency, nocturia, numbness, tingling, seizures,  Focal weakness, Loss of consciousness,  Tremor, insomnia, depression, anxiety, and suicidal ideation.    Physical Exam:  BP 136/80   Pulse 68   Temp 98.1 F (36.7 C) (Oral)   Ht 5' 10 (1.778 m)   Wt 214 lb 12.8 oz (97.4 kg)   SpO2 98%   BMI 30.82 kg/m    Physical Exam Vitals reviewed.  Constitutional:      General: He is not in acute distress.    Appearance: Normal appearance. He is normal weight. He is not ill-appearing, toxic-appearing or diaphoretic.  HENT:     Head: Normocephalic and atraumatic.     Right Ear: Tympanic membrane, ear canal and external ear normal. There is no impacted cerumen.  Left Ear: Tympanic membrane, ear canal and external ear normal. There is no impacted cerumen.     Nose: Nose normal.     Mouth/Throat:     Mouth: Mucous membranes are moist.     Pharynx: Oropharynx is clear.  Eyes:     General: No scleral icterus.       Right eye: No discharge.        Left eye: No discharge.     Conjunctiva/sclera: Conjunctivae normal.  Neck:     Thyroid : No thyromegaly.     Vascular: No carotid bruit or JVD.   Cardiovascular:     Rate and Rhythm: Normal rate and regular rhythm.     Heart sounds: Normal heart sounds.  Pulmonary:     Effort: Pulmonary effort is normal. No respiratory distress.     Breath sounds: Normal breath sounds.  Abdominal:     General: Bowel sounds are normal.     Palpations: Abdomen is soft. There is no mass.     Tenderness: There is no abdominal tenderness. There is no guarding or rebound.  Musculoskeletal:        General: Normal range of motion.     Cervical back: Normal range of motion and neck supple.  Lymphadenopathy:     Cervical: No cervical adenopathy.  Skin:    General: Skin is warm and dry.  Neurological:     General: No focal deficit present.     Mental Status: He is alert and oriented to person, place, and time. Mental status is at baseline.  Psychiatric:        Mood and Affect: Mood normal.        Behavior: Behavior normal.        Thought Content: Thought content normal.        Judgment: Judgment normal.     Assessment and Plan: Hyperlipidemia with target LDL less than 160 -     Lipid panel; Future -     LDL cholesterol, direct; Future -     TSH; Future  White coat syndrome with diagnosis of hypertension Assessment & Plan: Hypertension had resolved by home readings   b  Orders: -     Comprehensive metabolic panel with GFR; Future  Prostate cancer screening -     PSA; Future  Encounter for preventive health examination Assessment & Plan: age appropriate education and counseling updated, referrals for preventative services and immunizations addressed, dietary and smoking counseling addressed, most recent labs reviewed.  I have personally reviewed and have noted:   1) the patient's medical and social history 2) The pt's use of alcohol, tobacco, and illicit drugs 3) The patient's current medications and supplements 4) Functional ability including ADL's, fall risk, home safety risk, hearing and visual impairment 5) Diet and physical  activities 6) Evidence for depression or mood disorder 7) The patient's height, weight, and BMI have been recorded in the chart   I have made referrals, and provided counseling and education based on review of the above    Need for influenza vaccination -     Flu vaccine trivalent PF, 6mos and older(Flulaval,Afluria,Fluarix,Fluzone)  Post-traumatic osteoarthritis of left shoulder Assessment & Plan: Post traumatic,  Multiple dislocations,  Prior surgery,  Broken fixation screw was noted on plain films done in 2017 .  Replacement of joint anticipated in late August .  meloxicam  and tylenol  use reviewed.    Other orders -     Meloxicam ; Take 1 tablet (15 mg total) by mouth  daily. As needed for joint pain  Dispense: 90 tablet; Refill: 3 -     traZODone  HCl; Take 1 tablet (50 mg total) by mouth at bedtime as needed for sleep (insomnia).  Dispense: 90 tablet; Refill: 2    Return in about 1 year (around 05/23/2025) for physical.  Verneita LITTIE Kettering, MD

## 2024-05-23 NOTE — Patient Instructions (Addendum)
 Consider taking famotidine  on a regular basis for prevention of GERD and gastritis   Your blood pressure is much better since you quit  the [employer]  Flu vaccine today  I recommend the prevnar 20 vaccine against pneumonia,  the Shingrix vaccine  You can get your labs (including PSA) after October 7

## 2024-05-25 NOTE — Assessment & Plan Note (Signed)
 Post traumatic,  Multiple dislocations,  Prior surgery,  Broken fixation screw was noted on plain films done in 2017 .  Replacement of joint anticipated in late August .  meloxicam  and tylenol  use reviewed.

## 2024-05-25 NOTE — Assessment & Plan Note (Signed)
 Hypertension had resolved by home readings   b

## 2024-05-27 ENCOUNTER — Other Ambulatory Visit

## 2024-05-27 DIAGNOSIS — Z125 Encounter for screening for malignant neoplasm of prostate: Secondary | ICD-10-CM

## 2024-05-27 DIAGNOSIS — I10 Essential (primary) hypertension: Secondary | ICD-10-CM

## 2024-05-27 DIAGNOSIS — E785 Hyperlipidemia, unspecified: Secondary | ICD-10-CM

## 2024-05-27 LAB — COMPREHENSIVE METABOLIC PANEL WITH GFR
ALT: 20 U/L (ref 0–53)
AST: 15 U/L (ref 0–37)
Albumin: 4.5 g/dL (ref 3.5–5.2)
Alkaline Phosphatase: 73 U/L (ref 39–117)
BUN: 14 mg/dL (ref 6–23)
CO2: 28 meq/L (ref 19–32)
Calcium: 9.5 mg/dL (ref 8.4–10.5)
Chloride: 104 meq/L (ref 96–112)
Creatinine, Ser: 1.23 mg/dL (ref 0.40–1.50)
GFR: 66.49 mL/min (ref >60.00)
Glucose, Bld: 101 mg/dL — ABNORMAL HIGH (ref 70–99)
Potassium: 4.6 meq/L (ref 3.5–5.1)
Sodium: 140 meq/L (ref 135–145)
Total Bilirubin: 0.9 mg/dL (ref 0.2–1.2)
Total Protein: 6.5 g/dL (ref 6.0–8.3)

## 2024-05-27 LAB — LIPID PANEL
Cholesterol: 234 mg/dL — ABNORMAL HIGH (ref 0–200)
HDL: 50.8 mg/dL (ref >39.00)
LDL Cholesterol: 145 mg/dL — ABNORMAL HIGH (ref 0–99)
NonHDL: 183
Total CHOL/HDL Ratio: 5
Triglycerides: 190 mg/dL — ABNORMAL HIGH (ref 0.0–149.0)
VLDL: 38 mg/dL (ref 0.0–40.0)

## 2024-05-27 LAB — TSH: TSH: 1.87 u[IU]/mL (ref 0.35–5.50)

## 2024-05-27 LAB — PSA: PSA: 0.78 ng/mL (ref 0.10–4.00)

## 2024-05-27 LAB — LDL CHOLESTEROL, DIRECT: Direct LDL: 150 mg/dL

## 2024-05-28 ENCOUNTER — Ambulatory Visit: Payer: Self-pay | Admitting: Internal Medicine

## 2024-05-28 NOTE — Assessment & Plan Note (Signed)
 10 yr risk assessment for cardiac events  using the AHA risk calculator is 6% , and there is no evidence of atherosclerosis by prior imaging.  No treatment required.  Lab Results  Component Value Date   CHOL 234 (H) 05/27/2024   HDL 50.80 05/27/2024   LDLCALC 145 (H) 05/27/2024   LDLDIRECT 150.0 05/27/2024   TRIG 190.0 (H) 05/27/2024   CHOLHDL 5 05/27/2024

## 2025-06-01 ENCOUNTER — Encounter: Admitting: Internal Medicine
